# Patient Record
Sex: Male | Born: 1955
Health system: Southern US, Community
[De-identification: ages and names within clinical notes are randomized; demographics above are authoritative.]

## PROBLEM LIST (undated history)

## (undated) DIAGNOSIS — C801 Malignant (primary) neoplasm, unspecified: Secondary | ICD-10-CM

## (undated) DIAGNOSIS — E785 Hyperlipidemia, unspecified: Secondary | ICD-10-CM

## (undated) DIAGNOSIS — I10 Essential (primary) hypertension: Secondary | ICD-10-CM

## (undated) DIAGNOSIS — E119 Type 2 diabetes mellitus without complications: Secondary | ICD-10-CM

## (undated) DIAGNOSIS — Z9889 Other specified postprocedural states: Secondary | ICD-10-CM

## (undated) DIAGNOSIS — R112 Nausea with vomiting, unspecified: Secondary | ICD-10-CM

## (undated) DIAGNOSIS — Z8719 Personal history of other diseases of the digestive system: Secondary | ICD-10-CM

## (undated) HISTORY — DX: Personal history of other diseases of the digestive system: Z87.19

## (undated) HISTORY — DX: Essential (primary) hypertension: I10

## (undated) HISTORY — DX: Other specified postprocedural states: Z98.890

## (undated) HISTORY — DX: Hyperlipidemia, unspecified: E78.5

## (undated) HISTORY — PX: HEMORRHOID SURGERY: SHX153

## (undated) HISTORY — PX: HERNIA REPAIR: SHX51

## (undated) HISTORY — DX: Malignant (primary) neoplasm, unspecified: C80.1

---

## 1999-06-28 ENCOUNTER — Encounter (INDEPENDENT_AMBULATORY_CARE_PROVIDER_SITE_OTHER): Payer: Self-pay | Admitting: *Deleted

## 1999-06-28 ENCOUNTER — Inpatient Hospital Stay (HOSPITAL_COMMUNITY): Admission: AD | Admit: 1999-06-28 | Discharge: 1999-07-17 | Payer: Self-pay | Admitting: Nephrology

## 1999-06-28 ENCOUNTER — Encounter: Payer: Self-pay | Admitting: Nephrology

## 1999-06-29 ENCOUNTER — Encounter: Payer: Self-pay | Admitting: Nephrology

## 1999-07-03 ENCOUNTER — Encounter: Payer: Self-pay | Admitting: Nephrology

## 2003-07-17 ENCOUNTER — Ambulatory Visit (HOSPITAL_COMMUNITY): Admission: RE | Admit: 2003-07-17 | Discharge: 2003-07-17 | Payer: Self-pay | Admitting: Nephrology

## 2003-07-24 ENCOUNTER — Ambulatory Visit (HOSPITAL_COMMUNITY): Admission: RE | Admit: 2003-07-24 | Discharge: 2003-07-24 | Payer: Self-pay | Admitting: Nephrology

## 2004-08-19 ENCOUNTER — Ambulatory Visit (HOSPITAL_COMMUNITY): Admission: RE | Admit: 2004-08-19 | Discharge: 2004-08-19 | Payer: Self-pay | Admitting: Cardiology

## 2005-07-28 ENCOUNTER — Ambulatory Visit (HOSPITAL_COMMUNITY): Admission: RE | Admit: 2005-07-28 | Discharge: 2005-07-28 | Payer: Self-pay | Admitting: Nephrology

## 2005-07-28 ENCOUNTER — Encounter: Payer: Self-pay | Admitting: Vascular Surgery

## 2006-03-04 ENCOUNTER — Ambulatory Visit (HOSPITAL_COMMUNITY): Admission: RE | Admit: 2006-03-04 | Discharge: 2006-03-04 | Payer: Self-pay | Admitting: General Surgery

## 2006-04-28 DIAGNOSIS — C801 Malignant (primary) neoplasm, unspecified: Secondary | ICD-10-CM

## 2006-04-28 HISTORY — DX: Malignant (primary) neoplasm, unspecified: C80.1

## 2006-11-29 ENCOUNTER — Emergency Department (HOSPITAL_COMMUNITY): Admission: EM | Admit: 2006-11-29 | Discharge: 2006-11-29 | Payer: Self-pay | Admitting: Emergency Medicine

## 2007-02-17 ENCOUNTER — Inpatient Hospital Stay (HOSPITAL_COMMUNITY): Admission: RE | Admit: 2007-02-17 | Discharge: 2007-02-18 | Payer: Self-pay | Admitting: Urology

## 2007-02-17 ENCOUNTER — Encounter (INDEPENDENT_AMBULATORY_CARE_PROVIDER_SITE_OTHER): Payer: Self-pay | Admitting: Urology

## 2007-02-17 HISTORY — PX: PROSTATE SURGERY: SHX751

## 2008-06-06 ENCOUNTER — Encounter: Admission: RE | Admit: 2008-06-06 | Discharge: 2008-06-06 | Payer: Self-pay | Admitting: Nephrology

## 2010-05-19 ENCOUNTER — Encounter: Payer: Self-pay | Admitting: Nephrology

## 2010-09-10 NOTE — Op Note (Signed)
Stephen [name], MALAND NO.:  [id number]   MEDICAL RECORD NO.:  [id number]          PATIENT TYPE:  INP   LOCATION:  1425                         FACILITY:  [facility]   PHYSICIAN:  Valetta Fuller, M.D.  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   PROCEDURE PERFORMED:  Robotic-assisted laparoscopic radical retropubic  prostatectomy.   SURGEON:  Valetta Fuller, M.D.   ASSISTANT:  Sheppard Plumber. Earlene Plater, M.D.   ANESTHESIA:  General endotracheal.   INDICATIONS:  Mr. Cargile is a 55 year old male who was recently  diagnosed with favorable clinical stage T1c adenocarcinoma of the  prostate by Dr. Su Grand.  The patient had a very minimally elevated  PSA of approximately 4.  Biopsy revealed low-volume Gleason 3 + 3 = 6  tumor at the left base and left apex.  The patient's AUA symptom score  was minimal.  The patient did have poor preoperative sexual functioning.  The patient underwent extensive consultation with Dr. Su Grand as well  as myself with regard to treatment options.  He elected to have a  surgical approach.  He appeared to understand the advantages and  disadvantages of this approach.  We talked about issues related to  surgery, potential complications and specifically spent long period of  time discussing incontinence and sexual dysfunction issues.  The patient  has done the preparatory steps and has received preoperative education.  Preoperative Unasyn in was administered and compression boots were  placed prior to induction of anesthesia.   TECHNIQUE AND FINDINGS:  The patient was brought to the operating room,  where he had successful induction of general endotracheal anesthesia.  He  was placed in the mid lithotomy position.  He was secured to the  table of all extremities carefully padded and protected.  He was secured  to the table and then placed in steep Trendelenburg position.  He was  prepped and draped in the usual manner.  A Foley catheter was inserted  sterilely on the field.  The initial camera port incision site was  chosen to the left of the umbilicus, about 18 cm above the pubic  symphysis.  A standard open Hasson technique was utilized.  The  peritoneal cavity was entered and no obvious adhesions underneath the  incision were encountered.  A 12-mm trocar was placed and the abdominal  cavity was insufflated without incident.  All other trocars were then  placed with direct visual guidance; this included 12- and 5-mm assist  ports and three 8-mm robotic ports.  Once these ports were all in  position, the surgical cart was docked.  The bladder was filled and the  space of Retzius was entered by dropping the bladder posteriorly.  The  prostatic tissue and bladder neck area were defatted to allow for  careful identification of the endopelvic fascia  and bladder neck  regions.  The endopelvic fascia to the lateral aspect of the prostate  was then incised from apex to base.  The levator musculature was swept  off the apex of the prostate and dorsal venous complex to isolate that  structure.  Puboprostatic ligaments were taken down sharply.  The dorsal  vein was then ligated with an ETS stapling device.  Good hemostasis was  encountered.  The underlying urethra was uninjured.  The bladder neck  was then incised anteriorly with identification of this structure  utilizing the Foley catheter balloon.  Once the balloon was found in the  midline, it was retracted anteriorly.  There was no evidence of a middle  lobe component.  Indigo carmine was given and we were clearly well away  from the ureteral orifices.  The posterior bladder neck was then  transected, allowing  for identification posteriorly of the vas and  seminal vesicles.   The vas and seminal vesicles were individually isolated and dissected  free and then used for anterior retraction.  The posterior plane between  the prostate and rectum was then easily established.   Attention was then turned towards bilateral nerve spare.  The  superficial fascia overlying the prostate in the lateral aspect was then  incised and then the plane was established between the neurovascular  bundle and the capsule of the prostate.  The neurovascular bundle was  then bluntly dissected from apex to base.  Once the neurovascular bundle  was dropped off the lateral aspect of the prostate, we were able to lift  the prostate anteriorly and identify the vascular pedicles, which were  taken down with surgical clips; this was then done bilaterally and then  the prostate was only attached by some thin fascial attachments and the  urethra.  The urethra was then transected anteriorly and the catheter  was removed, allowing for completion of the urethral transection.  The  prostatic specimen was then brought out of the pelvis.  There was no  evidence of any rectal injury.   Attention was then turned towards reconstruction.  Some posterior  Denonvilliers' fascia was reapproximated utilizing 2-0 Vicryl suture to  allow for increased bladder support.  The urethra and bladder neck were  then reapproximated at the 6 o'clock position utilizing a single 2-0  Vicryl suture.  Indigo carmine was again given and we were well away  from the orifices.  The anastomosis was then completed in a 360-degree  running manner utilizing double-armed 3-0 Monocryl suture.  A new coude  catheter was placed without difficulty and the bladder irrigated,  demonstrating a watertight anastomosis.  Through one of the robotic  ports, we went ahead and placed a round drain into the pelvis, which was  then secured to the skin.  The prostatic specimen was  placed in the  Endopouch.  The 12-mm assist port was closed with a 0 Vicryl suture with  the aid of a suture passer under direct visual guidance.  All other  ports were then removed with direct visual guidance.  The camera port  incision had to be extended slightly and the specimen was then removed.  That fascia was then closed with a running 0 Vicryl suture.  All wounds  were infiltrated with Marcaine and then clips were used to close the  skin.  Blood loss was minimal and the patient remained completely stable  through the procedure.  The bladder irrigated clear.  The patient was  brought to the  recovery room in stable condition.           ______________________________  Valetta Fuller, M.D.  Electronically Signed     DSG/MEDQ  D:  02/18/2007  T:  02/19/2007  Job:  [id number]

## 2010-09-13 NOTE — Op Note (Signed)
NAME:  GAY, RAPE NO.:  [id number]   MEDICAL RECORD NO.:  [id number]          PATIENT TYPE:  AMB   LOCATION:  DAY                          FACILITY:  [facility]   PHYSICIAN:  Ollen Gross. Vernell Morgans, M.D. DATE OF BIRTH:  [birth year]   DATE OF PROCEDURE:  03/04/2006  DATE OF DISCHARGE:                               OPERATIVE REPORT   PREOPERATIVE DIAGNOSIS:  Internal hemorrhoids.   POSTOPERATIVE DIAGNOSIS:  Internal hemorrhoids.   PROCEDURE:  Internal hemorrhoid banding x3.   SURGEON:  Ollen Gross. Carolynne Edouard, M.D.   ANESTHESIA:  General via LMA.   DESCRIPTION OF PROCEDURE:  After informed consent was obtained, the  patient was brought to the operating room and placed in the supine  position on the operating table.  After the adequate induction of  general anesthesia, the patient was placed in the lithotomy position.  His perirectal area was prepped with Betadine and draped in the usual  sterile manner.  The perirectal area was then infiltrated 0.25% Marcaine  with epinephrine.  The tissue was massaged for several minutes.  A  bullet type retractor was then used to examine the rectum.  The patient  had some significant internal hemorrhoidal tissue, the most prominent  areas of internal hemorrhoidal tissue were anteriorly and then left and  right posteriorly.  A band was placed on each of these making sure that  the band was deep to the dentate line.  Once this was accomplished and  the bands appeared to be in good position, the area was completely  hemostatic. Sterile dressings were then applied and the patient  tolerated the procedure well.  The patient was then awakened and taken  to the recovery room in stable condition.      Ollen Gross. Vernell Morgans, M.D.  Electronically Signed     PST/MEDQ  D:  03/04/2006  T:  03/04/2006  Job:  [id number]

## 2010-09-13 NOTE — Discharge Summary (Signed)
[place]  Patient:    Stephen [name], Stephen [name]                         MRN: [id number] Adm. Date:  06/28/99 Disc. Date: 07/17/99 Attending:  Jarome Matin, M.D.                           Discharge Summary  ADMITTING DIAGNOSES: Acute elevation of BUN and creatinine, which appears to be acute failure of his kidneys after what appears to have been a viral type illness.  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  SGOT was 2360, SGPT was up to 910, and the patient came to the office on June 27, 1999.  Laboratories at that time showed a BUN up to 78 and creatinine 5.6.  SGOT was 2103 and SGPT was 795.  He was becoming more and more nauseated with more back pain, and we admitted the patient to the hospital.  The day of admission his BUN was up to 121 and creatinine was 8.8.  His liver functions still were abnormal.  Urine was brown.  His sodium and chloride were gradually falling.  His sodium was 127 and chloride was 88.  PHYSICAL EXAMINATION:  VITAL SIGNS: Blood pressure somewhat elevated at 156/90, temperature 98 degrees, pulse 66, respirations 20.  GENERAL: The patient was alert and oriented x 3.  He stated he had become nauseated and could not keep anything down.  HEENT: Negative.  SKIN: Skin and membranes were dry.  CHEST: Clear.  HEART: Regular sinus rhythm.  ABDOMEN:  Soft, no masses, no organomegaly.  EXTREMITIES: No leg edema and no edema anywhere.  LABORATORY DATA: Chest x-ray was normal.  HOSPITAL COURSE: At first we thought of the possibility of acute glomerulonephritis; however, he had a lot of protein in his urine, greater than 300.  Pertinent laboratories did show ANA was negative.  Anti-DNA was negative.  We did culture his urine and this had no significant growth.  Blood was cultured. Hemoglobin was large in amount in his urine, glucose was 100 and then negative.  Urobilinogen was 1.0.  A few epithelial cells were noted but he had just 0-5 wbc/hpf, 0-5 rbc/hpf, and rare bacteria noted in the urine sediment.  We tried to collect a 24 hour urine but only got 250 cc.  C4 and C3 compliments were low, 14.3 and 16, which actually was in the normal range. PSA was 2.44, which was in the normal range.  His liver chemistries continued to be elevated but gradually were coming down; however, CK was elevated and CK-MB was fairly well elevated.  Sodium was fairly normal.  Potassium was normal.  Glucose was running high at 294 and 156.  Creatinine continued to rise and actually rose to about 10.7, with creatinine of 143, but BUN went  up to 152.  Sedimentation rate also rose up to 77.  The patient was apparently getting worse.  His EKG showed a normal sinus rhythm, normal EKG.  We started him on dialysis and that seemed to help some; however, his BUN and creatinine continued to rise.  We did a renal biopsy on the patient and we received a call from Dr. Marland Kitchen the next day and he commented that it looked like acute tubular necrosis but it also looked like some evidence of - nephropathy which indicated possible myoglobinuria or hemoglobinuria.  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  We dialyzed him in the oliguric phase, which he went through first, and we then went to a diuretic phase, at which time we continued to give him adequate IV fluids.  The patient developed some gastritis and we used some Nexium to help cut down on the acid.  We at one point had started him on some steroids; however, we decided to go ahead and taper that off.  The patient denied any cocaine or alcoholic abuse.  He has a family and he seems to be a fairly stable individual.  We then followed him with dialysis and as an acute renal failure patient.  The patient gradually began to go through diuretic phase of ATN and after dialyzing him three to four times he began to stabilize.  His creatinine gradually reduced without dialysis and we kept the fluids going at an adequate rate to maintain his fluid balance and watched closely for electrolyte imbalance.  Gradually the patients renal function improved markedly, with his BUN going up to 11.3 and down to 24, creatinine down to 2.  The patient was eating and drinking well, and feeling well, and we decided we would talk to him about hydration, talk to him about when he started driving his truck again to drink plenty of fluids and take more frequent breaks and remain hydrated.  The patient seemed to respond favorably to these suggestions.  At the time of discharge many of his laboratory values had come back to normal.  We put him on Pepcid as an outpatient for his stomach upset.  We told him that we may well get an Upper GI Series if he continued to have problems as an outpatient.  His pressure did remain elevated and we decided to keep him on some blood pressure medication and put him on Catapres 0.1 mg t.i.d.  At the time of discharge  his BUN was 24, creatinine 2.0.  SPECIAL INSTRUCTIONS: He was told to drink plenty of fluids.  FOLLOW-UP: I am to see him in the office in a week before he goes back to  work, and we will recheck his renal and liver functions.  Both had gone back nearly to normal and the patient was feeling much better at the time of discharge. DD:  09/25/99 TD:  09/27/99 Job: [id number]

## 2010-12-09 ENCOUNTER — Ambulatory Visit
Admission: RE | Admit: 2010-12-09 | Discharge: 2010-12-09 | Disposition: A | Discharge status: Home / Self Care | Payer: BC Managed Care – PPO | Source: Ambulatory Visit | Attending: Nephrology | Admitting: Nephrology

## 2010-12-09 ENCOUNTER — Other Ambulatory Visit: Payer: Self-pay | Admitting: Nephrology

## 2010-12-09 DIAGNOSIS — Z87891 Personal history of nicotine dependence: Secondary | ICD-10-CM

## 2011-02-05 LAB — HEMOGLOBIN AND HEMATOCRIT, BLOOD
HCT: 46.7
Hemoglobin: 15.8

## 2011-02-05 LAB — DIFFERENTIAL
Eosinophils Relative: 0
Lymphs Abs: 0.8
Monocytes Absolute: 0.7
Monocytes Relative: 10
Neutro Abs: 6

## 2011-02-05 LAB — BASIC METABOLIC PANEL
BUN: 10
CO2: 26
Calcium: 9.4
Chloride: 104
Creatinine, Ser: 1.05
Creatinine, Ser: 1.12
GFR calc non Af Amer: >60
GFR calc non Af Amer: >60
Glucose, Bld: 141 — ABNORMAL HIGH
Sodium: 141

## 2011-02-05 LAB — CBC
HCT: 39.9
MCV: 85.8
RBC: 4.65
RDW: 13.6

## 2011-02-05 LAB — TYPE AND SCREEN

## 2011-02-10 LAB — URINE CULTURE: Culture: NO GROWTH

## 2011-02-10 LAB — URINALYSIS, ROUTINE W REFLEX MICROSCOPIC
Bilirubin Urine: NEGATIVE
Glucose, UA: NEGATIVE
pH: 6

## 2011-02-10 LAB — URINE MICROSCOPIC-ADD ON

## 2011-03-11 ENCOUNTER — Encounter (INDEPENDENT_AMBULATORY_CARE_PROVIDER_SITE_OTHER): Payer: Self-pay | Admitting: General Surgery

## 2011-03-12 ENCOUNTER — Ambulatory Visit (INDEPENDENT_AMBULATORY_CARE_PROVIDER_SITE_OTHER): Payer: BC Managed Care – PPO | Admitting: General Surgery

## 2011-03-12 ENCOUNTER — Encounter (INDEPENDENT_AMBULATORY_CARE_PROVIDER_SITE_OTHER): Payer: Self-pay | Admitting: General Surgery

## 2011-03-12 VITALS — BP 160/106 | HR 64 | Temp 97.4°F | Resp 18 | Ht 68.0 in | Wt 199.5 lb

## 2011-03-12 DIAGNOSIS — K4090 Unilateral inguinal hernia, without obstruction or gangrene, not specified as recurrent: Secondary | ICD-10-CM | POA: Insufficient documentation

## 2011-03-12 NOTE — Progress Notes (Signed)
Subjective:     Patient ID: Stephen [name], male   DOB: 12/10/1955, 55 y.o.   MRN: [id number]  HPI We're asked to see the patient in consultation by Dr. Grapey to evaluate him for right inguinal hernia. The patient is a 55-year-old black male who first started to notice a bulge in his right groin around springtime. He denies any pain in the area. He denies any nausea or vomiting. He denies any chest pain or shortness of breath. He has normal bowel movements. 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   HENT: Negative.   Eyes: Negative.   Respiratory: Negative.   Cardiovascular: Negative.   Gastrointestinal: Negative.   Genitourinary: Negative.   Musculoskeletal: Negative.   Skin: Negative.   Neurological: Negative.   Hematological: Negative.   Psychiatric/Behavioral: Negative.    Past Medical History  Diagnosis Date  . Hypertension   . Cancer   . Hyperlipidemia    Past Surgical History  Procedure Date  . Hemorrhoid surgery   . Prostate surgery 02/17/07   Current Outpatient Prescriptions  Medication Sig Dispense Refill  . Alprostadil (PROSTAGLANDIN E1) POWD by Does not apply route.        . aspirin 81 MG tablet Take 81 mg by mouth daily.        . fish oil-omega-3 fatty acids 1000 MG capsule Take 2 g by mouth daily.        . losartan (COZAAR) 50 MG tablet Take 50 mg by mouth daily.         No Known Allergies     Objective:   Physical Exam  Constitutional: He is oriented to person, place, and time. He appears well-developed and well-nourished.  HENT:  Head: Normocephalic and atraumatic.  Eyes: Conjunctivae and EOM are normal. Pupils are equal, round, and reactive to light.  Neck: Normal range of motion. Neck supple.  Cardiovascular: Normal rate, regular rhythm and normal heart sounds.   Pulmonary/Chest: Effort normal and breath sounds normal.  Abdominal: Soft. Bowel sounds are normal.    Genitourinary: Penis normal.       The patient has a small reducible bulge in the right groin. No palpable bulge or impulse with straining in the left groin.  Musculoskeletal: Normal range of motion.  Neurological: He is alert and oriented to person, place, and time.  Skin: Skin is warm and dry.  Psychiatric: He has a normal mood and affect. His behavior is normal.       Assessment:     Small right inguinal hernia    Plan:     Because of the risk of incarceration and strangulation I think he would benefit from having this fixed. He would also like to have this done. I have discussed with him in detail the risks and benefits of the operation to fix the hernia as well as some of the technical aspects including the use of mesh as well as the risk of injury to the testicular artery and vas and he understands and wishes to proceed.      

## 2011-03-24 ENCOUNTER — Encounter (HOSPITAL_BASED_OUTPATIENT_CLINIC_OR_DEPARTMENT_OTHER): Payer: Self-pay | Admitting: *Deleted

## 2011-03-24 NOTE — Progress Notes (Signed)
To come in for bmet and ekg 

## 2011-03-27 ENCOUNTER — Encounter (HOSPITAL_BASED_OUTPATIENT_CLINIC_OR_DEPARTMENT_OTHER)
Admission: RE | Admit: 2011-03-27 | Discharge: 2011-03-27 | Disposition: A | Discharge status: Home / Self Care | Payer: BC Managed Care – PPO | Source: Ambulatory Visit | Attending: General Surgery | Admitting: General Surgery

## 2011-03-27 LAB — BASIC METABOLIC PANEL
BUN: 14 mg/dL (ref 6–23)
Chloride: 100 mEq/L (ref 96–112)
Creatinine, Ser: 1.04 mg/dL (ref 0.50–1.35)
Glucose, Bld: 97 mg/dL (ref 70–99)
Potassium: 4.1 mEq/L (ref 3.5–5.1)

## 2011-03-28 ENCOUNTER — Encounter (HOSPITAL_BASED_OUTPATIENT_CLINIC_OR_DEPARTMENT_OTHER): Payer: Self-pay | Admitting: *Deleted

## 2011-03-28 ENCOUNTER — Encounter (HOSPITAL_BASED_OUTPATIENT_CLINIC_OR_DEPARTMENT_OTHER): Payer: Self-pay | Admitting: Anesthesiology

## 2011-03-28 ENCOUNTER — Ambulatory Visit (HOSPITAL_BASED_OUTPATIENT_CLINIC_OR_DEPARTMENT_OTHER): Payer: BC Managed Care – PPO | Admitting: Anesthesiology

## 2011-03-28 ENCOUNTER — Ambulatory Visit (HOSPITAL_BASED_OUTPATIENT_CLINIC_OR_DEPARTMENT_OTHER)
Admission: RE | Admit: 2011-03-28 | Discharge: 2011-03-28 | Disposition: A | Discharge status: Home / Self Care | Payer: BC Managed Care – PPO | Source: Ambulatory Visit | Attending: General Surgery | Admitting: General Surgery

## 2011-03-28 ENCOUNTER — Encounter (HOSPITAL_BASED_OUTPATIENT_CLINIC_OR_DEPARTMENT_OTHER)
Admission: RE | Disposition: A | Discharge status: Home / Self Care | Payer: Self-pay | Source: Ambulatory Visit | Attending: General Surgery

## 2011-03-28 ENCOUNTER — Ambulatory Visit: Admit: 2011-03-28 | Payer: Self-pay | Admitting: General Surgery

## 2011-03-28 DIAGNOSIS — I10 Essential (primary) hypertension: Secondary | ICD-10-CM | POA: Insufficient documentation

## 2011-03-28 DIAGNOSIS — K4090 Unilateral inguinal hernia, without obstruction or gangrene, not specified as recurrent: Secondary | ICD-10-CM

## 2011-03-28 DIAGNOSIS — Z01812 Encounter for preprocedural laboratory examination: Secondary | ICD-10-CM | POA: Insufficient documentation

## 2011-03-28 HISTORY — PX: INGUINAL HERNIA REPAIR: SHX194

## 2011-03-28 LAB — POCT HEMOGLOBIN-HEMACUE: Hemoglobin: 16.6 g/dL (ref 13.0–17.0)

## 2011-03-28 SURGERY — REPAIR, HERNIA, INGUINAL, ADULT
Anesthesia: General | Laterality: Right

## 2011-03-28 MED ORDER — CEFAZOLIN SODIUM-DEXTROSE 2-3 GM-% IV SOLR
2.0000 g | INTRAVENOUS | Status: AC
  Administered 2011-03-28: 2 g via INTRAVENOUS

## 2011-03-28 MED ORDER — PROPOFOL 10 MG/ML IV EMUL
INTRAVENOUS | Status: DC | PRN
  Administered 2011-03-28: 200 mg via INTRAVENOUS

## 2011-03-28 MED ORDER — PROMETHAZINE HCL 25 MG RE SUPP
25.0000 mg | Freq: Four times a day (QID) | RECTAL | Status: DC | PRN
  Administered 2011-03-28: 25 mg via RECTAL

## 2011-03-28 MED ORDER — BUPIVACAINE-EPINEPHRINE 0.25% -1:200000 IJ SOLN
INTRAMUSCULAR | Status: DC | PRN
  Administered 2011-03-28: 30 mL

## 2011-03-28 MED ORDER — MIDAZOLAM HCL 2 MG/2ML IJ SOLN
2.0000 mg | Freq: Once | INTRAMUSCULAR | Status: AC
  Administered 2011-03-28: 2 mg via INTRAVENOUS

## 2011-03-28 MED ORDER — BUPIVACAINE HCL (PF) 0.5 % IJ SOLN
INTRAMUSCULAR | Status: DC | PRN
  Administered 2011-03-28: 30 mL

## 2011-03-28 MED ORDER — FENTANYL CITRATE 0.05 MG/ML IJ SOLN
INTRAMUSCULAR | Status: DC | PRN
  Administered 2011-03-28 (×2): 25 ug via INTRAVENOUS

## 2011-03-28 MED ORDER — HYDROCODONE-ACETAMINOPHEN 5-325 MG PO TABS
1.0000 | ORAL_TABLET | ORAL | Status: AC | PRN
Start: 2011-03-28 — End: 2011-04-07

## 2011-03-28 MED ORDER — DROPERIDOL 2.5 MG/ML IJ SOLN: 0.6250 mg | INTRAMUSCULAR | Status: DC | PRN

## 2011-03-28 MED ORDER — LACTATED RINGERS IV SOLN
INTRAVENOUS | Status: DC
  Administered 2011-03-28 (×3): via INTRAVENOUS

## 2011-03-28 MED ORDER — DEXAMETHASONE SODIUM PHOSPHATE 4 MG/ML IJ SOLN
INTRAMUSCULAR | Status: DC | PRN
  Administered 2011-03-28: 10 mg via INTRAVENOUS

## 2011-03-28 MED ORDER — ONDANSETRON HCL 4 MG/2ML IJ SOLN
INTRAMUSCULAR | Status: DC | PRN
  Administered 2011-03-28: 4 mg via INTRAVENOUS

## 2011-03-28 MED ORDER — FENTANYL CITRATE 0.05 MG/ML IJ SOLN
100.0000 ug | Freq: Once | INTRAMUSCULAR | Status: AC
  Administered 2011-03-28: 100 ug via INTRAVENOUS

## 2011-03-28 MED ORDER — FENTANYL CITRATE 0.05 MG/ML IJ SOLN
25.0000 ug | INTRAMUSCULAR | Status: DC | PRN
  Administered 2011-03-28: 25 ug via INTRAVENOUS

## 2011-03-28 MED ORDER — HYDROMORPHONE HCL PF 1 MG/ML IJ SOLN: 0.2500 mg | INTRAMUSCULAR | Status: DC | PRN

## 2011-03-28 SURGICAL SUPPLY — 47 items
APL SKNCLS STERI-STRIP NONHPOA (GAUZE/BANDAGES/DRESSINGS)
BENZOIN TINCTURE PRP APPL 2/3 (GAUZE/BANDAGES/DRESSINGS) IMPLANT
BLADE HEX COATED 2.75 (ELECTRODE) ×2 IMPLANT
BLADE SURG 15 STRL LF DISP TIS (BLADE) ×1 IMPLANT
BLADE SURG 15 STRL SS (BLADE) ×1
BLADE SURG ROTATE 9660 (MISCELLANEOUS) IMPLANT
CHLORAPREP W/TINT 26ML (MISCELLANEOUS) ×2 IMPLANT
CLOTH BEACON ORANGE TIMEOUT ST (SAFETY) ×2 IMPLANT
COVER MAYO STAND STRL (DRAPES) ×2 IMPLANT
COVER TABLE BACK 60X90 (DRAPES) ×2 IMPLANT
DECANTER SPIKE VIAL GLASS SM (MISCELLANEOUS) IMPLANT
DERMABOND ADVANCED (GAUZE/BANDAGES/DRESSINGS) ×1
DERMABOND ADVANCED .7 DNX12 (GAUZE/BANDAGES/DRESSINGS) ×1 IMPLANT
DRAIN PENROSE 1/2X12 LTX STRL (WOUND CARE) ×2 IMPLANT
DRAPE LAPAROTOMY TRNSV 102X78 (DRAPE) ×2 IMPLANT
DRAPE UTILITY XL STRL (DRAPES) ×2 IMPLANT
ELECT REM PT RETURN 9FT ADLT (ELECTROSURGICAL) ×2
ELECTRODE REM PT RTRN 9FT ADLT (ELECTROSURGICAL) ×1 IMPLANT
GLOVE BIO SURGEON STRL SZ7 (GLOVE) ×2 IMPLANT
GLOVE BIO SURGEON STRL SZ7.5 (GLOVE) ×4 IMPLANT
GOWN PREVENTION PLUS XLARGE (GOWN DISPOSABLE) IMPLANT
MESH HERNIA SYS ULTRAPRO LRG (Mesh General) ×2 IMPLANT
NEEDLE HYPO 22GX1.5 SAFETY (NEEDLE) IMPLANT
NEEDLE HYPO 25X1 1.5 SAFETY (NEEDLE) ×2 IMPLANT
NS IRRIG 1000ML POUR BTL (IV SOLUTION) ×2 IMPLANT
PACK BASIN DAY SURGERY FS (CUSTOM PROCEDURE TRAY) ×2 IMPLANT
PAIN PUMP ON-Q 100MLX2ML 2.5IN (PAIN MANAGEMENT) IMPLANT
PENCIL BUTTON HOLSTER BLD 10FT (ELECTRODE) ×2 IMPLANT
SLEEVE SCD COMPRESS KNEE MED (MISCELLANEOUS) ×2 IMPLANT
SPONGE GAUZE 4X4 12PLY (GAUZE/BANDAGES/DRESSINGS) ×2 IMPLANT
SPONGE LAP 18X18 X RAY DECT (DISPOSABLE) ×2 IMPLANT
STRIP CLOSURE SKIN 1/2X4 (GAUZE/BANDAGES/DRESSINGS) IMPLANT
SUT MON AB 4-0 PC3 18 (SUTURE) ×2 IMPLANT
SUT PROLENE 2 0 SH DA (SUTURE) ×4 IMPLANT
SUT SILK 2 0 SH (SUTURE) ×2 IMPLANT
SUT SILK 3 0 SH 30 (SUTURE) IMPLANT
SUT SILK 3 0 TIES 17X18 (SUTURE) ×1
SUT SILK 3-0 18XBRD TIE BLK (SUTURE) ×1 IMPLANT
SUT VIC AB 0 SH 27 (SUTURE) ×2 IMPLANT
SUT VIC AB 2-0 SH 27 (SUTURE) ×2
SUT VIC AB 2-0 SH 27XBRD (SUTURE) ×1 IMPLANT
SUT VIC AB 3-0 SH 27 (SUTURE) ×1
SUT VIC AB 3-0 SH 27X BRD (SUTURE) ×1 IMPLANT
SYR CONTROL 10ML LL (SYRINGE) ×2 IMPLANT
TOWEL OR 17X24 6PK STRL BLUE (TOWEL DISPOSABLE) ×4 IMPLANT
TOWEL OR NON WOVEN STRL DISP B (DISPOSABLE) ×2 IMPLANT
WATER STERILE IRR 1000ML POUR (IV SOLUTION) ×2 IMPLANT

## 2011-03-28 NOTE — Transfer of Care (Signed)
Immediate Anesthesia Transfer of Care Note  Patient: Stephen [name]  Procedure(s) Performed:  HERNIA REPAIR INGUINAL ADULT  Patient Location: PACU  Anesthesia Type: GA combined with regional for post-op pain  Level of Consciousness: sedated and patient cooperative  Airway & Oxygen Therapy: Patient Spontanous Breathing and Patient connected to face mask oxygen  Post-op Assessment: Report given to PACU RN and Post -op Vital signs reviewed and stable  Post vital signs: Reviewed and stable  Complications: No apparent anesthesia complications

## 2011-03-28 NOTE — Anesthesia Preprocedure Evaluation (Addendum)
Anesthesia Evaluation  Patient identified by MRN, date of birth, ID band Patient awake    Reviewed: Allergy & Precautions, H&P , NPO status , Patient's Chart, lab work & pertinent test results  Airway       Dental   Pulmonary neg pulmonary ROS,  clear to auscultation  Pulmonary exam normal       Cardiovascular hypertension, Pt. on medications Regular Normal- Systolic murmurs    Neuro/Psych Negative Neurological ROS     GI/Hepatic Neg liver ROS,   Endo/Other  Negative Endocrine ROS  Renal/GU negative Renal ROS     Musculoskeletal   Abdominal   Peds  Hematology   Anesthesia Other Findings   Reproductive/Obstetrics                           Anesthesia Physical Anesthesia Plan  ASA: II  Anesthesia Plan: General   Post-op Pain Management:    Induction: Intravenous  Airway Management Planned: LMA  Additional Equipment:   Intra-op Plan:   Post-operative Plan: Extubation in OR  Informed Consent: I have reviewed the patients History and Physical, chart, labs and discussed the procedure including the risks, benefits and alternatives for the proposed anesthesia with the patient or authorized representative who has indicated his/her understanding and acceptance.     Plan Discussed with: CRNA and Surgeon  Anesthesia Plan Comments: (TAP block for POP.)       Anesthesia Quick Evaluation

## 2011-03-28 NOTE — Procedures (Signed)
Assisted Dr. Singer with right, transabdominal plane block. Side rails up, monitors on throughout procedure. See vital signs in flow sheet. Tolerated Procedure well. 

## 2011-03-28 NOTE — Interval H&P Note (Signed)
History and Physical Interval Note:  03/28/2011 11:07 AM  Stephen [name]  has presented today for surgery, with the diagnosis of right inguinal hernia  The various methods of treatment have been discussed with the patient and family. After consideration of risks, benefits and other options for treatment, the patient has consented to  Procedure(s): HERNIA REPAIR INGUINAL ADULT as a surgical intervention .  The patients' history has been reviewed, patient examined, no change in status, stable for surgery.  I have reviewed the patients' chart and labs.  Questions were answered to the patient's satisfaction.     TOTH III,PAUL S

## 2011-03-28 NOTE — Op Note (Signed)
03/28/2011  12:51 PM  PATIENT:  Stephen Stephen [name]  55 y.o. male  PRE-OPERATIVE DIAGNOSIS:  right inguinal hernia  POST-OPERATIVE DIAGNOSIS:  right inguinal hernia  PROCEDURE:  Procedure(s): Right HERNIA REPAIR INGUINAL ADULT  SURGEON:  Surgeon(s): Caleen Essex III, MD  PHYSICIAN ASSISTANT:   ASSISTANTS: none   ANESTHESIA:   general  EBL:  Total I/O In: 1000 [I.V.:1000] Out: -   BLOOD ADMINISTERED:none  DRAINS: none   LOCAL MEDICATIONS USED:  MARCAINE 30CC  SPECIMEN:  No Specimen  DISPOSITION OF SPECIMEN:  N/A  COUNTS:  YES  TOURNIQUET:  * No tourniquets in log *  DICTATION: .Dragon Dictation  Stephen [name] OF CARE: Discharge to home after PACU  PATIENT DISPOSITION:  PACU - hemodynamically stable.   Procedure: After informed consent was obtained the patient was brought to the operating room placed in the supine position on the operating room table. After adequate and induction of general anesthesia the patient's right abdomen and groinarea were prepped with ChloraPrep, allowed to dry, and draped in usual sterile manner. The right groin area was then infiltrated with quarter percent Marcaine. A small incision was made from the edge of the pubic tubercle on the right towards the anteriorsuperior iliac spine. This incision was carried down through the subcutaneous tissue sharply with electrocautery until the fascia of the external oblique was encountered. A small bridging vein was clamped with hemostats divided and ligated with 3-0 silk ties. The external oblique was opened along its fibers towards the apex of the external ring. A [place] retractor was deployed. Blunt dissection was carried out of the cord until the cord could be surrounded between 2 fingers. A half-inch Penrose drain was placed around the cord structures for retraction purposes. The cord was gently skeletonized and there was no hernia sac associated with the cord. There was a broad defect of the floor of the canal.  The hernia was easily reduced and the floor of the canal was repaired with a running 0 Vicryl stitch. The tails were left long at the edge of the cord. A 3 x 6 piece of ultrapro mesh was chosen and cut to fit. The mesh was sewn inferiorly to the shelving edge of the inguinal ligament with a running 2-0 Prolene stitch.superiorly the mesh was sewed to the musculoaponeurotic strength layer of the transversalis with interrupted 2-0 Prolene vertical mattress stitches. Tails were cut in the mesh laterally. The tails were wrapped around the cord structures and anchored to the shelving edge of inguinal ligament with an interrupted 2-0 Prolene stitch. Once this was accomplished the mesh was in good position in the hernia appeared to be well repaired.the ilioinguinal nerve was identified along the superior edge of the operative bed and was spared. The wound was then irrigated with copious amounts of saline. The external oblique was reapproximated with a running 2-0 Vicryl stitch. The subcutaneous fascia was reapproximated with a running 3-0 Vicryl stitch. The skin was then closed with a running 4-0 Monocryl subcuticular stitch. A Dermabond dressing was applied. At the end of the case all needle sponge and instrument counts were correct. The patient was awakened and taken to recovery in stable condition. The patient's testicles in the scrotum at the end of the case.

## 2011-03-28 NOTE — Anesthesia Procedure Notes (Addendum)
Anesthesia Regional Block:  TAP block  Pre-Anesthetic Checklist: ,, timeout performed, Correct Patient, Correct Site, Correct Laterality, Correct Procedure, Correct Position, site marked, Risks and benefits discussed,  Surgical consent,  Pre-op evaluation,  At surgeon's request and post-op pain management  Laterality: Right  Prep: chloraprep       Needles:  Injection technique: Single-shot  Needle Type: Echogenic Stimulator Needle          Additional Needles:  Procedures: ultrasound guided TAP block Narrative:  Start time: 03/28/2011 11:04 AM End time: 03/28/2011 11:14 AM Anesthesiologist: Halford Decamp, MD  Additional Notes: A functioning IV was confirmed and monitors were applied.  Sterile prep and drape, hand hygiene and sterile gloves were used.  Negative aspiration and test dose prior to incremental administration of local anesthetic. The patient tolerated the procedure well. Ultrasound guidance: relevent anatomy identified, needle position confirmed, local anesthetic spread visualized in appropriate plane, vascular puncture avoided.  Image printed for medical record.    Procedure Name: LMA Insertion Date/Time: 03/28/2011 11:35 AM Performed by: Jearld Shines Pre-anesthesia Checklist: Patient identified, Timeout performed, Emergency Drugs available, Suction available and Patient being monitored Patient Re-evaluated:Patient Re-evaluated prior to inductionOxygen Delivery Method: Circle System Utilized Preoxygenation: Pre-oxygenation with 100% oxygen Intubation Type: IV induction Ventilation: Mask ventilation without difficulty LMA: LMA inserted LMA Size: 5.0 Number of attempts: 1 Placement Confirmation: positive ETCO2 and breath sounds checked- equal and bilateral Tube secured with: Tape Dental Injury: Teeth and Oropharynx as per pre-operative assessment

## 2011-03-28 NOTE — H&P (View-Only) (Signed)
Subjective:     Patient ID: Stephen [name], male   DOB: 08/01/1955, 55 y.o.   MRN: [id number]  HPI We're asked to see the patient in consultation by Dr. Isabel Caprice to evaluate him for right inguinal hernia. The patient is a 55 year old black male who first started to notice a bulge in his right groin around springtime. He denies any pain in the area. He denies any nausea or vomiting. He denies any chest pain or shortness of breath. He has normal bowel movements. 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   HENT: Negative.   Eyes: Negative.   Respiratory: Negative.   Cardiovascular: Negative.   Gastrointestinal: Negative.   Genitourinary: Negative.   Musculoskeletal: Negative.   Skin: Negative.   Neurological: Negative.   Hematological: Negative.   Psychiatric/Behavioral: Negative.    Past Medical History  Diagnosis Date  . Hypertension   . Cancer   . Hyperlipidemia    Past Surgical History  Procedure Date  . Hemorrhoid surgery   . Prostate surgery 02/17/07   Current Outpatient Prescriptions  Medication Sig Dispense Refill  . Alprostadil (PROSTAGLANDIN E1) POWD by Does not apply route.        Marland Kitchen aspirin 81 MG tablet Take 81 mg by mouth daily.        . fish oil-omega-3 fatty acids 1000 MG capsule Take 2 g by mouth daily.        Marland Kitchen losartan (COZAAR) 50 MG tablet Take 50 mg by mouth daily.         No Known Allergies     Objective:   Physical Exam  Constitutional: He is oriented to person, place, and time. He appears well-developed and well-nourished.  HENT:  Head: Normocephalic and atraumatic.  Eyes: Conjunctivae and EOM are normal. Pupils are equal, round, and reactive to light.  Neck: Normal range of motion. Neck supple.  Cardiovascular: Normal rate, regular rhythm and normal heart sounds.   Pulmonary/Chest: Effort normal and breath sounds normal.  Abdominal: Soft. Bowel sounds are normal.    Genitourinary: Penis normal.       The patient has a small reducible bulge in the right groin. No palpable bulge or impulse with straining in the left groin.  Musculoskeletal: Normal range of motion.  Neurological: He is alert and oriented to person, place, and time.  Skin: Skin is warm and dry.  Psychiatric: He has a normal mood and affect. His behavior is normal.       Assessment:     Small right inguinal hernia    Plan:     Because of the risk of incarceration and strangulation I think he would benefit from having this fixed. He would also like to have this done. I have discussed with him in detail the risks and benefits of the operation to fix the hernia as well as some of the technical aspects including the use of mesh as well as the risk of injury to the testicular artery and vas and he understands and wishes to proceed.

## 2011-03-28 NOTE — Anesthesia Postprocedure Evaluation (Signed)
Anesthesia Post Note  Patient: Stephen [name]  Procedure(s) Performed:  HERNIA REPAIR INGUINAL ADULT  Anesthesia type: General  Patient location: PACU  Post pain: Pain level controlled  Post assessment: Patient's Cardiovascular Status Stable  Last Vitals:  Filed Vitals:   03/28/11 1500  BP: 167/96  Pulse: 56  Temp:   Resp: 13    Post vital signs: Reviewed and stable  Level of consciousness: sedated  Complications: No apparent anesthesia complications

## 2011-04-01 ENCOUNTER — Encounter (HOSPITAL_BASED_OUTPATIENT_CLINIC_OR_DEPARTMENT_OTHER): Payer: Self-pay | Admitting: General Surgery

## 2011-04-30 ENCOUNTER — Ambulatory Visit (INDEPENDENT_AMBULATORY_CARE_PROVIDER_SITE_OTHER): Payer: BC Managed Care – PPO | Admitting: General Surgery

## 2011-04-30 ENCOUNTER — Encounter (INDEPENDENT_AMBULATORY_CARE_PROVIDER_SITE_OTHER): Payer: Self-pay | Admitting: General Surgery

## 2011-04-30 VITALS — BP 142/100 | HR 88 | Temp 98.0°F | Resp 16 | Ht 68.0 in | Wt 203.2 lb

## 2011-04-30 DIAGNOSIS — K4090 Unilateral inguinal hernia, without obstruction or gangrene, not specified as recurrent: Secondary | ICD-10-CM

## 2011-04-30 NOTE — Progress Notes (Signed)
Subjective:     Patient ID: Stephen [name], male   DOB: July 22, 1955, 56 y.o.   MRN: [id number]  HPI The patient is a 56 year old black male who is now one month out from a right inguinal hernia repair with mesh. He initially had a lot of soreness but this is now resolving. He seems to move around fairly comfortably. His appetite is good and his bowels are working normally.  Review of Systems     Objective:   Physical Exam On exam his abdomen is soft and nontender. His right inguinal incision is healing nicely with no sign of infection. He has no palpable evidence for recurrence of the hernia.   Assessment:     One month postop from a right inguinal hernia repair with mesh    Plan:     At this point I would like him to refrain from lifting anything heavy for another week or 2. After that he can return to work. We will plan to see him back in about another month to check his progress.

## 2011-04-30 NOTE — Patient Instructions (Signed)
No heavy lifting for about 2 more weeks

## 2011-05-29 ENCOUNTER — Encounter (INDEPENDENT_AMBULATORY_CARE_PROVIDER_SITE_OTHER): Payer: Self-pay | Admitting: General Surgery

## 2011-05-29 ENCOUNTER — Ambulatory Visit (INDEPENDENT_AMBULATORY_CARE_PROVIDER_SITE_OTHER): Payer: BC Managed Care – PPO | Admitting: General Surgery

## 2011-05-29 VITALS — BP 144/92 | HR 65 | Temp 98.8°F | Ht 68.0 in | Wt 210.0 lb

## 2011-05-29 DIAGNOSIS — K4090 Unilateral inguinal hernia, without obstruction or gangrene, not specified as recurrent: Secondary | ICD-10-CM

## 2011-05-29 NOTE — Progress Notes (Signed)
Subjective:     Patient ID: Stephen [name], male   DOB: 1955-05-15, 56 y.o.   MRN: [id number]  HPI The patient is a 56 year old black male who is now 2 months out from a right inguinal hernia repair with mesh. Overall he seems to be doing very well. He will still have occasional episodes of soreness in the right groin when he does too much. Most days though he has no pain at all.  Review of Systems     Objective:   Physical Exam On exam his abdomen is soft and nontender. His right groin incision is healing nicely. He has no palpable evidence for recurrence of the hernia.    Assessment:     2 months status post right inguinal hernia repair with mesh    Plan:     At this point I believe he can return to all his normal activities without any restrictions. We will plan to see him back on a p.r.n. basis

## 2011-05-29 NOTE — Patient Instructions (Signed)
May return to all normal activities 

## 2012-03-01 ENCOUNTER — Other Ambulatory Visit: Payer: Self-pay | Admitting: Nephrology

## 2012-03-01 ENCOUNTER — Ambulatory Visit
Admission: RE | Admit: 2012-03-01 | Discharge: 2012-03-01 | Disposition: A | Discharge status: Home / Self Care | Payer: BC Managed Care – PPO | Source: Ambulatory Visit | Attending: Nephrology | Admitting: Nephrology

## 2012-03-01 DIAGNOSIS — R52 Pain, unspecified: Secondary | ICD-10-CM

## 2015-01-15 ENCOUNTER — Other Ambulatory Visit: Payer: Self-pay | Admitting: Internal Medicine

## 2015-01-15 ENCOUNTER — Ambulatory Visit
Admission: RE | Admit: 2015-01-15 | Discharge: 2015-01-15 | Disposition: A | Discharge status: Home / Self Care | Payer: Commercial Managed Care - PPO | Source: Ambulatory Visit | Attending: Internal Medicine | Admitting: Internal Medicine

## 2015-01-15 DIAGNOSIS — C61 Malignant neoplasm of prostate: Secondary | ICD-10-CM

## 2015-07-24 ENCOUNTER — Other Ambulatory Visit: Payer: Self-pay | Admitting: Internal Medicine

## 2015-07-24 DIAGNOSIS — R109 Unspecified abdominal pain: Secondary | ICD-10-CM

## 2015-07-31 ENCOUNTER — Ambulatory Visit
Admission: RE | Admit: 2015-07-31 | Discharge: 2015-07-31 | Disposition: A | Discharge status: Home / Self Care | Payer: Commercial Managed Care - PPO | Source: Ambulatory Visit | Attending: Internal Medicine | Admitting: Internal Medicine

## 2015-07-31 DIAGNOSIS — R109 Unspecified abdominal pain: Secondary | ICD-10-CM

## 2017-02-05 DIAGNOSIS — Z0000 Encounter for general adult medical examination without abnormal findings: Secondary | ICD-10-CM | POA: Diagnosis not present

## 2017-02-05 DIAGNOSIS — I10 Essential (primary) hypertension: Secondary | ICD-10-CM | POA: Diagnosis not present

## 2017-02-05 DIAGNOSIS — R7303 Prediabetes: Secondary | ICD-10-CM | POA: Diagnosis not present

## 2017-02-05 DIAGNOSIS — Z23 Encounter for immunization: Secondary | ICD-10-CM | POA: Diagnosis not present

## 2017-02-05 DIAGNOSIS — Z1159 Encounter for screening for other viral diseases: Secondary | ICD-10-CM | POA: Diagnosis not present

## 2017-02-05 DIAGNOSIS — Z1389 Encounter for screening for other disorder: Secondary | ICD-10-CM | POA: Diagnosis not present

## 2017-02-05 DIAGNOSIS — E782 Mixed hyperlipidemia: Secondary | ICD-10-CM | POA: Diagnosis not present

## 2017-02-05 DIAGNOSIS — C61 Malignant neoplasm of prostate: Secondary | ICD-10-CM | POA: Diagnosis not present

## 2017-03-11 DIAGNOSIS — C61 Malignant neoplasm of prostate: Secondary | ICD-10-CM | POA: Diagnosis not present

## 2017-03-20 DIAGNOSIS — H53021 Refractive amblyopia, right eye: Secondary | ICD-10-CM | POA: Diagnosis not present

## 2018-01-25 DIAGNOSIS — Z1211 Encounter for screening for malignant neoplasm of colon: Secondary | ICD-10-CM | POA: Diagnosis not present

## 2018-01-25 DIAGNOSIS — K594 Anal spasm: Secondary | ICD-10-CM | POA: Diagnosis not present

## 2018-02-02 DIAGNOSIS — K5730 Diverticulosis of large intestine without perforation or abscess without bleeding: Secondary | ICD-10-CM | POA: Diagnosis not present

## 2018-02-02 DIAGNOSIS — Z1211 Encounter for screening for malignant neoplasm of colon: Secondary | ICD-10-CM | POA: Diagnosis not present

## 2018-03-10 DIAGNOSIS — Z125 Encounter for screening for malignant neoplasm of prostate: Secondary | ICD-10-CM | POA: Diagnosis not present

## 2018-03-10 DIAGNOSIS — C61 Malignant neoplasm of prostate: Secondary | ICD-10-CM | POA: Diagnosis not present

## 2018-03-10 DIAGNOSIS — Z23 Encounter for immunization: Secondary | ICD-10-CM | POA: Diagnosis not present

## 2018-03-10 DIAGNOSIS — E782 Mixed hyperlipidemia: Secondary | ICD-10-CM | POA: Diagnosis not present

## 2018-03-10 DIAGNOSIS — I10 Essential (primary) hypertension: Secondary | ICD-10-CM | POA: Diagnosis not present

## 2018-03-10 DIAGNOSIS — Z0000 Encounter for general adult medical examination without abnormal findings: Secondary | ICD-10-CM | POA: Diagnosis not present

## 2018-03-10 DIAGNOSIS — R7303 Prediabetes: Secondary | ICD-10-CM | POA: Diagnosis not present

## 2018-03-12 DIAGNOSIS — C61 Malignant neoplasm of prostate: Secondary | ICD-10-CM | POA: Diagnosis not present

## 2018-09-13 DIAGNOSIS — Z8546 Personal history of malignant neoplasm of prostate: Secondary | ICD-10-CM | POA: Diagnosis not present

## 2018-09-13 DIAGNOSIS — R7303 Prediabetes: Secondary | ICD-10-CM | POA: Diagnosis not present

## 2021-10-02 ENCOUNTER — Other Ambulatory Visit (HOSPITAL_COMMUNITY): Payer: Self-pay | Admitting: Urology

## 2021-10-02 DIAGNOSIS — C61 Malignant neoplasm of prostate: Secondary | ICD-10-CM

## 2021-10-07 ENCOUNTER — Encounter (HOSPITAL_COMMUNITY)
Admission: RE | Admit: 2021-10-07 | Discharge: 2021-10-07 | Disposition: A | Discharge status: Home / Self Care | Payer: Commercial Managed Care - PPO | Source: Ambulatory Visit | Attending: Urology | Admitting: Urology

## 2021-10-07 DIAGNOSIS — C61 Malignant neoplasm of prostate: Secondary | ICD-10-CM | POA: Diagnosis present

## 2021-10-07 MED ORDER — PIFLIFOLASTAT F 18 (PYLARIFY) INJECTION
9.0000 | Freq: Once | INTRAVENOUS | Status: AC
  Administered 2021-10-07: 8.2 via INTRAVENOUS

## 2022-01-15 ENCOUNTER — Ambulatory Visit
Admission: EM | Admit: 2022-01-15 | Discharge: 2022-01-15 | Disposition: A | Discharge status: Home / Self Care | Payer: Commercial Managed Care - PPO | Attending: Nurse Practitioner | Admitting: Nurse Practitioner

## 2022-01-15 ENCOUNTER — Encounter: Payer: Self-pay | Admitting: Emergency Medicine

## 2022-01-15 ENCOUNTER — Other Ambulatory Visit: Payer: Self-pay

## 2022-01-15 DIAGNOSIS — J029 Acute pharyngitis, unspecified: Secondary | ICD-10-CM | POA: Diagnosis not present

## 2022-01-15 DIAGNOSIS — Z1152 Encounter for screening for COVID-19: Secondary | ICD-10-CM | POA: Diagnosis not present

## 2022-01-15 DIAGNOSIS — J069 Acute upper respiratory infection, unspecified: Secondary | ICD-10-CM | POA: Insufficient documentation

## 2022-01-15 LAB — RESP PANEL BY RT-PCR (FLU A&B, COVID) ARPGX2
Influenza A by PCR: NEGATIVE
Influenza B by PCR: NEGATIVE
SARS Coronavirus 2 by RT PCR: NEGATIVE

## 2022-01-15 LAB — POCT RAPID STREP A (OFFICE): Rapid Strep A Screen: NEGATIVE

## 2022-01-15 MED ORDER — CETIRIZINE HCL 10 MG PO TABS: 10.0000 mg | ORAL_TABLET | Freq: Every day | ORAL | 0 refills | Status: DC

## 2022-01-15 MED ORDER — FLUTICASONE PROPIONATE 50 MCG/ACT NA SUSP: 2.0000 | Freq: Every day | NASAL | 0 refills | Status: DC

## 2022-01-15 MED ORDER — PROMETHAZINE-DM 6.25-15 MG/5ML PO SYRP: 5.0000 mL | ORAL_SOLUTION | Freq: Four times a day (QID) | ORAL | 0 refills | Status: DC | PRN

## 2022-01-15 NOTE — ED Provider Notes (Signed)
[facility]    CSN: [id number] Arrival date & time: 01/15/22  0813      History   Chief Complaint Chief Complaint  Patient presents with   Sore Throat    HPI Stephen [name] is a 66 y.o. male.   The history is provided by the patient.   Patient presents with a 2-day history of sore throat, nasal congestion, and cough.  Patient states that his mother was recently diagnosed with the same or similar symptoms.  He denies fever, chills, ear pain, wheezing, shortness of breath, difficulty breathing, or GI symptoms.  Patient states that his throat pain worsens at night and in the morning and improves throughout the day.  Patient states that he does have a history of reflux disease.  He states that he has been taking Advil and using normal saline nasal spray for his symptoms.  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  Past Medical History:  Diagnosis Date   Cancer [facility]) 2008   prostate   History of inguinal hernia repair    right   Hyperlipidemia    Hypertension     Patient Active Problem List   Diagnosis Date Noted   Right inguinal hernia 03/12/2011    Past Surgical History:  Procedure Laterality Date   HEMORRHOID SURGERY     HERNIA REPAIR     INGUINAL HERNIA REPAIR  03/28/2011   Procedure: HERNIA REPAIR INGUINAL ADULT;  Surgeon: Merrie Roof, MD;  Location: [place];  Service: General;  Laterality: Right;   PROSTATE SURGERY  02/17/07       Home Medications    Prior to Admission medications   Medication Sig Start Date End Date Taking? Authorizing Provider  atorvastatin (LIPITOR) 10 MG tablet Take 10 mg by mouth daily.   Yes [provider]  cetirizine (ZYRTEC) 10 MG tablet Take 1 tablet (10 mg total) by mouth daily. 01/15/22  Yes Felecia Stanfill-Warren, Alda Lea, NP  fluticasone (FLONASE) 50 MCG/ACT nasal spray Place 2 sprays into both nostrils daily. 01/15/22  Yes Zylen Wenig-Warren, Alda Lea, NP  promethazine-dextromethorphan  (PROMETHAZINE-DM) 6.25-15 MG/5ML syrup Take 5 mLs by mouth 4 (four) times daily as needed for cough. 01/15/22  Yes Glenys Snader-Warren, Alda Lea, NP  Alprostadil (PROSTAGLANDIN E1) POWD by Does not apply route.      [provider]  aspirin 81 MG tablet Take 81 mg by mouth daily.      [provider]  fish oil-omega-3 fatty acids 1000 MG capsule Take 2 g by mouth daily.      [provider]  losartan (COZAAR) 50 MG tablet Take 50 mg by mouth daily.      [provider]    Family History Family History  Problem Relation Age of Onset   Diabetes Father    Heart disease Father     Social History Social History   Tobacco Use   Smoking status: Former   Smokeless tobacco: Never  Substance Use Topics   Alcohol use: No   Drug use: No     Allergies   Patient has no known allergies.   Review of Systems Review of Systems Per HPI  Physical Exam Triage Vital Signs ED Triage Vitals  Enc Vitals Group     BP 01/15/22 0838 (!) 160/68     Pulse Rate 01/15/22 0838 77     Resp 01/15/22 0838 20     Temp 01/15/22 0838 98.3 F (36.8 C)  Temp Source 01/15/22 0838 Oral     SpO2 01/15/22 0838 98 %     Weight --      Height --      Head Circumference --      Peak Flow --      Pain Score 01/15/22 0839 1     Pain Loc --      Pain Edu? --      Excl. in [place]? --    No data found.  Updated Vital Signs BP (!) 160/68 (BP Location: Right Arm)   Pulse 77   Temp 98.3 F (36.8 C) (Oral)   Resp 20   SpO2 98%   Visual Acuity Right Eye Distance:   Left Eye Distance:   Bilateral Distance:    Right Eye Near:   Left Eye Near:    Bilateral Near:     Physical Exam Vitals and nursing note reviewed.  Constitutional:      General: He is not in acute distress.    Appearance: Normal appearance. He is well-developed.  HENT:     Head: Normocephalic.     Right Ear: Tympanic membrane, ear canal and external ear normal.     Left Ear: Tympanic membrane, ear canal  and external ear normal.     Nose: Congestion present.     Right Turbinates: Enlarged and swollen.     Left Turbinates: Enlarged and swollen.     Right Sinus: No maxillary sinus tenderness or frontal sinus tenderness.     Left Sinus: No maxillary sinus tenderness or frontal sinus tenderness.     Mouth/Throat:     Lips: Pink.     Mouth: Mucous membranes are moist.     Pharynx: Uvula midline. Posterior oropharyngeal erythema and uvula swelling present. No oropharyngeal exudate.     Tonsils: 1+ on the right. 1+ on the left.  Eyes:     Extraocular Movements: Extraocular movements intact.     Conjunctiva/sclera: Conjunctivae normal.     Pupils: Pupils are equal, round, and reactive to light.  Cardiovascular:     Rate and Rhythm: Normal rate and regular rhythm.     Pulses: Normal pulses.     Heart sounds: Normal heart sounds.  Pulmonary:     Effort: Pulmonary effort is normal. No respiratory distress.     Breath sounds: Normal breath sounds. No stridor. No wheezing, rhonchi or rales.  Abdominal:     General: Bowel sounds are normal. There is no distension.     Palpations: Abdomen is soft.     Tenderness: There is no abdominal tenderness.  Musculoskeletal:     Cervical back: Normal range of motion.  Lymphadenopathy:     Cervical: No cervical adenopathy.  Skin:    General: Skin is warm and dry.  Neurological:     General: No focal deficit present.     Mental Status: He is alert and oriented to person, place, and time.  Psychiatric:        Mood and Affect: Mood normal.        Behavior: Behavior normal.      UC Treatments / Results  Labs (all labs ordered are listed, but only abnormal results are displayed) Labs Reviewed  RESP PANEL BY RT-PCR (FLU A&B, COVID) ARPGX2  CULTURE, GROUP A STREP [facility])  POCT RAPID STREP A (OFFICE)    EKG   Radiology No results found.  Procedures Procedures (including critical care time)  Medications Ordered in UC Medications - No data to  display  Initial Impression / Assessment and Plan / UC Course  I have reviewed the triage vital signs and the nursing notes.  Pertinent labs & imaging results that were available during my care of the patient were reviewed by me and considered in my medical decision making (see chart for details).  Patient presents with a 2-day history of sore throat, nasal congestion, and cough.  On exam, patient's vital signs are stable, he is in no acute distress.  Rapid strep test was negative, COVID test pending at this time.  Differential diagnoses include viral upper respiratory infection with cough, COVID, and seasonal allergies.  Symptomatic treatment was provided for the patient to include Promethazine DM, cetirizine, and Flonase.  Supportive care recommendations were provided to the patient.  Patient was also giving a work note until his COVID results are received.  Patient verbalizes understanding.  All questions were answered. Final Clinical Impressions(s) / UC Diagnoses   Final diagnoses:  Viral upper respiratory tract infection with cough  Encounter for screening for COVID-19  Sore throat     Discharge Instructions      Rapid strep test is negative, COVID/flu test and throat culture are pending.  If the results of your test are positive, you will be contacted.  As discussed, if your COVID test is positive, you are a candidate to receive molnupiravir as an antiviral. Take medication as prescribed. Increase fluids and allow for plenty of rest. Recommend Tylenol or ibuprofen as needed for pain, fever, or general discomfort. Recommend throat lozenges, Chloraseptic or honey to help with throat pain. Warm salt water gargles 3-4 times daily to help with throat pain or discomfort. Recommend a diet with soft foods to include soups, broths, puddings, yogurt, Jell-O's, or popsicles until symptoms improve. Follow-up in this clinic or with your primary care physician if your symptoms fail to improve..        ED Prescriptions     Medication Sig Dispense Auth. Provider   promethazine-dextromethorphan (PROMETHAZINE-DM) 6.25-15 MG/5ML syrup Take 5 mLs by mouth 4 (four) times daily as needed for cough. 140 mL Paden Kuras-Warren, Alda Lea, NP   fluticasone (FLONASE) 50 MCG/ACT nasal spray Place 2 sprays into both nostrils daily. 16 g Laiza Veenstra-Warren, Alda Lea, NP   cetirizine (ZYRTEC) 10 MG tablet Take 1 tablet (10 mg total) by mouth daily. 30 tablet Aijalon Kirtz-Warren, Alda Lea, NP      PDMP not reviewed this encounter.   Tish Men, NP 01/15/22 0945

## 2022-01-15 NOTE — Discharge Instructions (Addendum)
Rapid strep test is negative, COVID/flu test and throat culture are pending.  If the results of your test are positive, you will be contacted.  As discussed, if your COVID test is positive, you are a candidate to receive molnupiravir as an antiviral. Take medication as prescribed. Increase fluids and allow for plenty of rest. Recommend Tylenol or ibuprofen as needed for pain, fever, or general discomfort. Recommend throat lozenges, Chloraseptic or honey to help with throat pain. Warm salt water gargles 3-4 times daily to help with throat pain or discomfort. Recommend a diet with soft foods to include soups, broths, puddings, yogurt, Jell-O's, or popsicles until symptoms improve. Follow-up in this clinic or with your primary care physician if your symptoms fail to improve.Marland Kitchen

## 2022-01-15 NOTE — ED Triage Notes (Signed)
Pt reports sore throat and cough since Monday. Pt denies any other symptoms or known fever. Pt reports mother had something similar and tested negative for covid.

## 2022-01-18 LAB — CULTURE, GROUP A STREP (THRC)

## 2022-09-13 ENCOUNTER — Other Ambulatory Visit: Payer: Self-pay

## 2022-09-13 ENCOUNTER — Inpatient Hospital Stay (HOSPITAL_COMMUNITY)
Admission: EM | Admit: 2022-09-13 | Discharge: 2022-09-15 | DRG: 322 | Disposition: A | Discharge status: Home / Self Care | Payer: 59 | Attending: Internal Medicine | Admitting: Internal Medicine

## 2022-09-13 ENCOUNTER — Emergency Department (HOSPITAL_COMMUNITY): Payer: 59

## 2022-09-13 ENCOUNTER — Encounter (HOSPITAL_COMMUNITY): Payer: Self-pay

## 2022-09-13 DIAGNOSIS — I214 Non-ST elevation (NSTEMI) myocardial infarction: Secondary | ICD-10-CM | POA: Diagnosis present

## 2022-09-13 DIAGNOSIS — Z79899 Other long term (current) drug therapy: Secondary | ICD-10-CM

## 2022-09-13 DIAGNOSIS — E785 Hyperlipidemia, unspecified: Secondary | ICD-10-CM | POA: Diagnosis present

## 2022-09-13 DIAGNOSIS — R9431 Abnormal electrocardiogram [ECG] [EKG]: Secondary | ICD-10-CM | POA: Diagnosis not present

## 2022-09-13 DIAGNOSIS — Z87891 Personal history of nicotine dependence: Secondary | ICD-10-CM | POA: Diagnosis not present

## 2022-09-13 DIAGNOSIS — Z8546 Personal history of malignant neoplasm of prostate: Secondary | ICD-10-CM | POA: Diagnosis not present

## 2022-09-13 DIAGNOSIS — E118 Type 2 diabetes mellitus with unspecified complications: Secondary | ICD-10-CM | POA: Diagnosis present

## 2022-09-13 DIAGNOSIS — I252 Old myocardial infarction: Secondary | ICD-10-CM | POA: Diagnosis not present

## 2022-09-13 DIAGNOSIS — I10 Essential (primary) hypertension: Secondary | ICD-10-CM | POA: Diagnosis present

## 2022-09-13 DIAGNOSIS — I213 ST elevation (STEMI) myocardial infarction of unspecified site: Secondary | ICD-10-CM

## 2022-09-13 DIAGNOSIS — Z7984 Long term (current) use of oral hypoglycemic drugs: Secondary | ICD-10-CM | POA: Diagnosis not present

## 2022-09-13 DIAGNOSIS — Z8249 Family history of ischemic heart disease and other diseases of the circulatory system: Secondary | ICD-10-CM

## 2022-09-13 DIAGNOSIS — Z833 Family history of diabetes mellitus: Secondary | ICD-10-CM | POA: Diagnosis not present

## 2022-09-13 DIAGNOSIS — I2510 Atherosclerotic heart disease of native coronary artery without angina pectoris: Secondary | ICD-10-CM | POA: Diagnosis present

## 2022-09-13 DIAGNOSIS — Z7982 Long term (current) use of aspirin: Secondary | ICD-10-CM | POA: Diagnosis not present

## 2022-09-13 LAB — HEPARIN LEVEL (UNFRACTIONATED)
Heparin Unfractionated: 0.84 IU/mL — ABNORMAL HIGH (ref 0.30–0.70)
Heparin Unfractionated: 1 IU/mL — ABNORMAL HIGH (ref 0.30–0.70)

## 2022-09-13 LAB — CBC
HCT: 48.7 % (ref 39.0–52.0)
Hemoglobin: 16.5 g/dL (ref 13.0–17.0)
MCH: 29.1 pg (ref 26.0–34.0)
MCHC: 33.9 g/dL (ref 30.0–36.0)
MCV: 85.9 fL (ref 80.0–100.0)
Platelets: 201 10*3/uL (ref 150–400)
RBC: 5.67 MIL/uL (ref 4.22–5.81)
RDW: 13.1 % (ref 11.5–15.5)
WBC: 8.1 10*3/uL (ref 4.0–10.5)
nRBC: 0 % (ref 0.0–0.2)

## 2022-09-13 LAB — TROPONIN I (HIGH SENSITIVITY)
Troponin I (High Sensitivity): >24000 ng/L (ref <18)
Troponin I (High Sensitivity): >24000 ng/L (ref <18)

## 2022-09-13 LAB — GLUCOSE, CAPILLARY: Glucose-Capillary: 140 mg/dL — ABNORMAL HIGH (ref 70–99)

## 2022-09-13 LAB — MRSA NEXT GEN BY PCR, NASAL: MRSA by PCR Next Gen: NOT DETECTED

## 2022-09-13 LAB — BASIC METABOLIC PANEL
Anion gap: 10 (ref 5–15)
BUN: 14 mg/dL (ref 8–23)
CO2: 24 mmol/L (ref 22–32)
Calcium: 8.9 mg/dL (ref 8.9–10.3)
Chloride: 101 mmol/L (ref 98–111)
Creatinine, Ser: 0.96 mg/dL (ref 0.61–1.24)
GFR, Estimated: >60 mL/min (ref >60)
Glucose, Bld: 134 mg/dL — ABNORMAL HIGH (ref 70–99)
Potassium: 3.4 mmol/L — ABNORMAL LOW (ref 3.5–5.1)
Sodium: 135 mmol/L (ref 135–145)

## 2022-09-13 LAB — D-DIMER, QUANTITATIVE: D-Dimer, Quant: 0.39 ug/mL-FEU (ref 0.00–0.50)

## 2022-09-13 MED ORDER — ONDANSETRON HCL 4 MG/2ML IJ SOLN
4.0000 mg | Freq: Four times a day (QID) | INTRAMUSCULAR | Status: DC | PRN
  Filled 2022-09-13: qty 2

## 2022-09-13 MED ORDER — ATORVASTATIN CALCIUM 80 MG PO TABS
80.0000 mg | ORAL_TABLET | Freq: Every day | ORAL | Status: DC
  Administered 2022-09-13 – 2022-09-14 (×2): 80 mg via ORAL
  Filled 2022-09-13 (×2): qty 1

## 2022-09-13 MED ORDER — MORPHINE SULFATE (PF) 2 MG/ML IV SOLN: 2.0000 mg | INTRAVENOUS | Status: DC | PRN

## 2022-09-13 MED ORDER — NITROGLYCERIN 0.4 MG SL SUBL: 0.4000 mg | SUBLINGUAL_TABLET | SUBLINGUAL | Status: DC | PRN

## 2022-09-13 MED ORDER — HEPARIN (PORCINE) 25000 UT/250ML-% IV SOLN
1200.0000 [IU]/h | INTRAVENOUS | Status: DC
  Administered 2022-09-13: 1200 [IU]/h via INTRAVENOUS
  Filled 2022-09-13: qty 250

## 2022-09-13 MED ORDER — METOPROLOL SUCCINATE ER 50 MG PO TB24
50.0000 mg | ORAL_TABLET | Freq: Every day | ORAL | Status: DC
  Administered 2022-09-13: 50 mg via ORAL
  Filled 2022-09-13: qty 1

## 2022-09-13 MED ORDER — POTASSIUM CHLORIDE 10 MEQ/100ML IV SOLN
10.0000 meq | INTRAVENOUS | Status: AC
  Administered 2022-09-13 (×2): 10 meq via INTRAVENOUS
  Filled 2022-09-13 (×2): qty 100

## 2022-09-13 MED ORDER — ACETAMINOPHEN 325 MG PO TABS: 650.0000 mg | ORAL_TABLET | ORAL | Status: DC | PRN

## 2022-09-13 MED ORDER — HEPARIN BOLUS VIA INFUSION
4000.0000 [IU] | Freq: Once | INTRAVENOUS | Status: AC
  Administered 2022-09-13: 4000 [IU] via INTRAVENOUS

## 2022-09-13 MED ORDER — METOPROLOL TARTRATE 12.5 MG HALF TABLET
12.5000 mg | ORAL_TABLET | Freq: Two times a day (BID) | ORAL | Status: DC
  Administered 2022-09-14 – 2022-09-15 (×3): 12.5 mg via ORAL
  Filled 2022-09-13 (×3): qty 1

## 2022-09-13 MED ORDER — ASPIRIN 81 MG PO TBEC
81.0000 mg | DELAYED_RELEASE_TABLET | Freq: Every day | ORAL | Status: DC
  Administered 2022-09-14 – 2022-09-15 (×2): 81 mg via ORAL
  Filled 2022-09-13 (×2): qty 1

## 2022-09-13 MED ORDER — ASPIRIN 325 MG PO TABS
325.0000 mg | ORAL_TABLET | Freq: Once | ORAL | Status: AC
  Administered 2022-09-13: 325 mg via ORAL
  Filled 2022-09-13: qty 1

## 2022-09-13 MED ORDER — MORPHINE SULFATE (PF) 4 MG/ML IV SOLN
4.0000 mg | Freq: Once | INTRAVENOUS | Status: AC
  Administered 2022-09-13: 4 mg via INTRAVENOUS
  Filled 2022-09-13: qty 1

## 2022-09-13 NOTE — ED Provider Notes (Signed)
This patient is an ill-appearing 67 year old male, he has a history of borderline hypertension, borderline diabetes, takes his medications as prescribed.  He does not take an aspirin daily.  He states that when he got off of work several days ago he went to get on his spin bike in his bedroom and felt like he was having significant discomfort in his chest he was sweaty, he got off of the bike and had some difficulty falling asleep feeling like he had to set up, in the morning it was gone and he was able to function throughout the day.  When he got off work on Wednesday and then again on Thursday the exact same thing happened.  Last night when this occurred it has not stopped and he continues to be having chest pain and a heavy feeling on his chest.  He was diaphoretic at some points during this time and is still having some pain.  On exam he has clear heart and lung sounds, normal pulses, no edema, no JVD.  He has a totally normal neurologic exam of his upper extremities with normal sensation, equal pulses, normal strength.  I have personally viewed and interpreted his EKG which shows that he is got inferior T wave inversions as well as lateral precordial inversions, in conjunction with a troponin of over 20,000 he has had a non-ST elevation myocardial infarction.  In fact he already has Q waves in the inferior leads concerning for a infarct which is evolving.  I have repeated the EKG, it is unchanged, we will start heparin, the patient will be admitted, I will consult with cardiology, he is critically ill  .Critical Care  Performed by: Eber Hong, MD Authorized by: Eber Hong, MD   Critical care provider statement:    Critical care time (minutes):  45   Critical care time was exclusive of:  Separately billable procedures and treating other patients and teaching time   Critical care was necessary to treat or prevent imminent or life-threatening deterioration of the following conditions:  Cardiac  failure   Critical care was time spent personally by me on the following activities:  Development of treatment plan with patient or surrogate, discussions with consultants, evaluation of patient's response to treatment, examination of patient, obtaining history from patient or surrogate, ordering and performing treatments and interventions, ordering and review of laboratory studies, ordering and review of radiographic studies, pulse oximetry, re-evaluation of patient's condition and review of old charts   I assumed direction of critical care for this patient from another provider in my specialty: no     Care discussed with: admitting provider   Comments:        I discussed this patient's care with Dr. Carolan Clines who request the patient be transferred and admitted to [facility] to a cardiac telemetry bed, she agrees with nitroglycerin, the patient is already on a statin, we will add 50 mg of metoprolol at her request.  Final diagnoses:  NSTEMI (non-ST elevated myocardial infarction) [facility])      Eber Hong, MD 09/13/22 1733

## 2022-09-13 NOTE — ED Notes (Signed)
Attempted report to [facility] and will I call back

## 2022-09-13 NOTE — H&P (Signed)
Cardiology Admission History and Physical   Patient ID: SHER GILPIN MRN: [id number]; DOB: December 08, 1955   Admission date: 09/13/2022  PCP:  Georgann Housekeeper, MD   [facility] Providers Cardiologist:  None        Chief Complaint:  Chest pain  Patient Profile:   MYSHON ZWAHLEN is a 67 y.o. male with pmh sx for HTN, DM and remote hx of prostate ca who is being seen 09/13/2022 for the evaluation of NSTEMI.  History of Present Illness:   Mr. Tischner is a 67 y.o. male with pmh sx for HTN, DM and remote hx of prostate ca who is being seen 09/13/2022 for the evaluation of NSTEMI. He had been doing well since 2-3 days when he started having sudden chest pain. He had diaphoresis as well. This pain again recurred yesterday hence came to the ED. He felt heavy on his chest as well. Came to the ED where troponin was >24K. EKG showed ST depressions and TWI in inferior and lateral leads. Case was discussed with Dr. Wyline Mood and [facility], and he was accepted to come here for possible LHC on Monday if he remains HDS and CP free- as it was thought by Dr. Wyline Mood that he had already infarcted. Currently he reports no chest pain or SOB. VS are stable. Has some nausea. He was started on heparin in the ED.    Past Medical History:  Diagnosis Date   Cancer [facility]) 2008   prostate   History of inguinal hernia repair    right   Hyperlipidemia    Hypertension     Past Surgical History:  Procedure Laterality Date   HEMORRHOID SURGERY     HERNIA REPAIR     INGUINAL HERNIA REPAIR  03/28/2011   Procedure: HERNIA REPAIR INGUINAL ADULT;  Surgeon: Robyne Askew, MD;  Location: [facility];  Service: General;  Laterality: Right;   PROSTATE SURGERY  02/17/07     Medications Prior to Admission: Prior to Admission medications   Medication Sig Start Date End Date Taking? Authorizing Provider  atorvastatin (LIPITOR) 10 MG tablet Take 10 mg by mouth daily.   Yes [provider]   esomeprazole (NEXIUM) 20 MG capsule Take 20 mg by mouth daily at 12 noon.   Yes [provider]  Glucosamine Sulfate 1000 MG CAPS Take 2 capsules by mouth daily.   Yes [provider]  ibuprofen (ADVIL) 200 MG tablet Take 400 mg by mouth every 6 (six) hours as needed for moderate pain.   Yes [provider]  losartan-hydrochlorothiazide (HYZAAR) 100-12.5 MG tablet Take 1 tablet by mouth daily.   Yes [provider]  metFORMIN (GLUCOPHAGE) 500 MG tablet Take 500 mg by mouth daily.   Yes [provider]     Allergies:   No Known Allergies  Social History:   Social History   Socioeconomic History   Marital status: Divorced    Spouse name: Not on file   Number of children: Not on file   Years of education: Not on file   Highest education level: Not on file  Occupational History   Not on file  Tobacco Use   Smoking status: Former   Smokeless tobacco: Never  Substance and Sexual Activity   Alcohol use: No   Drug use: No   Sexual activity: Not on file  Other Topics Concern   Not on file  Social History Narrative   Not on file  Social Determinants of Health   Financial Resource Strain: Not on file  Food Insecurity: Not on file  Transportation Needs: Not on file  Physical Activity: Not on file  Stress: Not on file  Social Connections: Not on file  Intimate Partner Violence: Not on file    Family History:  The patient's family history includes Diabetes in his father; Heart disease in his father.    ROS:  Please see the history of present illness.  All other ROS reviewed and negative.     Physical Exam/Data:   Vitals:   09/13/22 1730 09/13/22 1826 09/13/22 1835 09/13/22 1932  BP: (!) 140/87  134/79 (!) 148/77  Pulse: 83  73 68  Resp: 15  18 20   Temp:   98.3 F (36.8 C) 98.4 F (36.9 C)  TempSrc:   Oral Oral  SpO2: 98%   95%  Weight:  87.8 kg    Height:        Intake/Output Summary (Last 24 hours) at 09/13/2022  2046 Last data filed at 09/13/2022 1900 Gross per 24 hour  Intake 72.91 ml  Output --  Net 72.91 ml      09/13/2022    6:26 PM 09/13/2022    3:30 PM 10/07/2021    1:00 PM  Last 3 Weights  Weight (lbs) 193 lb 8 oz 190 lb 210 lb 12.2 oz  Weight (kg) 87.771 kg 86.183 kg 95.6 kg     Body mass index is 29.42 kg/m.  General:  Well nourished, well developed, in no acute distress HEENT: normal Neck: no JVD Vascular: No carotid bruits; Distal pulses 2+ bilaterally   Cardiac:  normal S1, S2; RRR; no murmur  Lungs:  clear to auscultation bilaterally, no wheezing, rhonchi or rales  Abd: soft, nontender, no hepatomegaly  Ext: no edema Musculoskeletal:  No deformities, BUE and BLE strength normal and equal Skin: warm and dry  Neuro:  CNs 2-12 intact, no focal abnormalities noted Psych:  Normal affect   EKG:  The ECG that was done  was personally reviewed and demonstrates ST depressions and TWI in inferior and lateral leads  Laboratory Data:  High Sensitivity Troponin:   Recent Labs  Lab 09/13/22 1440 09/13/22 1603  TROPONINIHS >24,000* >24,000*      Chemistry Recent Labs  Lab 09/13/22 1440  NA 135  K 3.4*  CL 101  CO2 24  GLUCOSE 134*  BUN 14  CREATININE 0.96  CALCIUM 8.9  GFRNONAA >60  ANIONGAP 10    No results for input(s): "PROT", "ALBUMIN", "AST", "ALT", "ALKPHOS", "BILITOT" in the last 168 hours. Lipids No results for input(s): "CHOL", "TRIG", "HDL", "LABVLDL", "LDLCALC", "CHOLHDL" in the last 168 hours. Hematology Recent Labs  Lab 09/13/22 1440  WBC 8.1  RBC 5.67  HGB 16.5  HCT 48.7  MCV 85.9  MCH 29.1  MCHC 33.9  RDW 13.1  PLT 201   Thyroid No results for input(s): "TSH", "FREET4" in the last 168 hours. BNPNo results for input(s): "BNP", "PROBNP" in the last 168 hours.  DDimer  Recent Labs  Lab 09/13/22 1512  DDIMER 0.39     Radiology/Studies:  DG Chest 2 View  Result Date: 09/13/2022 CLINICAL DATA:  Chest pain EXAM: CHEST - 2 VIEW  COMPARISON:  Chest radiograph dated 01/15/2015 FINDINGS: Normal lung volumes. No focal consolidations. No pleural effusion or pneumothorax. The heart size and mediastinal contours are within normal limits. No acute osseous abnormality. IMPRESSION: No active cardiopulmonary disease. Electronically Signed   By: Benjaman Kindler  Xu M.D.   On: 09/13/2022 14:37     Assessment and Plan:   # NSTEMI # HLD # HTN  Patient coming with CP and troponin >24K and EKG changes showing ST depression and Twi in inferior and lateral leads.  Case was discussed with Dr. Wyline Mood and [facility], and he was accepted to come here for possible LHC on Monday if he remains HDS and CP free- as it was thought by Dr. Wyline Mood that he had already infarcted.  Aspirin 325 mg already administered Aspirin 81 mg daily IV Heparin Atorvastatin 80 mg Zofran for nausea Morphine for pain- currently no pain Metoprolol 12.5 mg BID Recheck EKG to ensure no STEMI Echo in AM If remains CP free and HD stable, LHC in AM per Dr. Wyline Mood   For questions or updates, please contact [facility] Please consult [email or web address] for contact info under     Signed, Hermelinda Dellen, MD  09/13/2022 8:46 PM

## 2022-09-13 NOTE — ED Notes (Signed)
2nd attempt to give report. [facility] at bedside.

## 2022-09-13 NOTE — ED Triage Notes (Signed)
Pt c/o chest pain that began last night around 1900. Describes as a stabbing pain and tightness. Denies any sob.

## 2022-09-13 NOTE — Progress Notes (Signed)
ANTICOAGULATION CONSULT NOTE - Follow Up Consult  Pharmacy Consult for Heparin Indication: chest pain/ACS  No Known Allergies  Patient Measurements: Height: 5\' 8"  (172.7 cm) Weight: 86.2 kg (190 lb) IBW/kg (Calculated) : 68.4 Heparin Dosing Weight: 85.7 kg  Vital Signs: Temp: 98.5 F (36.9 C) (05/18 1423) Temp Source: Oral (05/18 1423) BP: 155/88 (05/18 1530) Pulse Rate: 66 (05/18 1530)  Labs: Recent Labs    09/13/22 1440  HGB 16.5  HCT 48.7  PLT 201  CREATININE 0.96  TROPONINIHS >24,000*    Estimated Creatinine Clearance: 83 mL/min (by C-G formula based on SCr of 0.96 mg/dL).  Assessment: 66 yoM hx htn, hld admitted with CP and elevated troponins, pharmacy consulted to start systemic heparin. No contraindications noted  Goal of Therapy:  Heparin level 0.3-0.7 units/ml Monitor platelets by anticoagulation protocol: Yes   Plan:  Heparin bolus 4000 units Heparin gtt at 1200 units/hr HL in 6 hrs Daily HL and CBC  Caryl Asp, PharmD Clinical Pharmacist 09/13/2022 3:57 PM

## 2022-09-13 NOTE — ED Provider Notes (Signed)
[place] EMERGENCY DEPARTMENT AT [facility] Provider Note   CSN: [id number] Arrival date & time: 09/13/22  1417     History  Chief Complaint  Patient presents with   Chest Pain   HPI Stephen [name] is a 67 y.o. male with hypertension, hyperlipidemia, remote history of prostate cancer status prostatectomy presenting for chest pain.  Started 7 PM last night. Located in the center of his chest and at times radiates to the left chest.  Feels stabbing at times but now more dull and achy.  Nonreproducible.  Unsure if exertional.  Denies associated shortness of breath.  States he has been nauseous.  Denies calf tenderness.  Did mention that he had prostate cancer but his prostate was removed 2008.  No known coagulopathies.  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   Chest Pain      Home Medications Prior to Admission medications   Medication Sig Start Date End Date Taking? Authorizing Provider  Alprostadil (PROSTAGLANDIN E1) POWD by Does not apply route.      [provider]  aspirin 81 MG tablet Take 81 mg by mouth daily.      [provider]  atorvastatin (LIPITOR) 10 MG tablet Take 10 mg by mouth daily.    [provider]  cetirizine (ZYRTEC) 10 MG tablet Take 1 tablet (10 mg total) by mouth daily. 01/15/22   Leath-Warren, Sadie Haber, NP  fish oil-omega-3 fatty acids 1000 MG capsule Take 2 g by mouth daily.      [provider]  fluticasone (FLONASE) 50 MCG/ACT nasal spray Place 2 sprays into both nostrils daily. 01/15/22   Leath-Warren, Sadie Haber, NP  losartan (COZAAR) 50 MG tablet Take 50 mg by mouth daily.      [provider]  promethazine-dextromethorphan (PROMETHAZINE-DM) 6.25-15 MG/5ML syrup Take 5 mLs by mouth 4 (four) times daily as needed for cough. 01/15/22   Leath-Warren, Sadie Haber, NP      Allergies    Patient has no known allergies.    Review of  Systems   Review of Systems  Cardiovascular:  Positive for chest pain.    Physical Exam   Vitals:   09/13/22 1530 09/13/22 1558  BP: (!) 155/88   Pulse: 66 99  Resp: 15 19  Temp:    SpO2: 96% 98%    CONSTITUTIONAL:  well-appearing, NAD NEURO:  Alert and oriented x 3, CN 3-12 grossly intact EYES:  eyes equal and reactive ENT/NECK:  Supple, no stridor  CARDIO: Regular rate and rhythm, appears well-perfused, radial pulses 2+ PULM:  No respiratory distress, CTAB GI/GU:  non-distended, soft MSK/SPINE:  No gross deformities, no edema, moves all extremities  SKIN:  no rash, atraumatic  *Additional and/or pertinent findings included in MDM below  ED Results / Procedures / Treatments   Labs (all labs ordered are listed, but only abnormal results are displayed) Labs Reviewed  BASIC METABOLIC PANEL - Abnormal; Notable for the following components:      Result Value   Potassium 3.4 (*)    Glucose, Bld 134 (*)    All other components within normal limits  TROPONIN I (HIGH SENSITIVITY) - Abnormal; Notable for the following components:   Troponin I (High Sensitivity) >24,000 (*)    All other components within normal limits  CBC  D-DIMER, QUANTITATIVE  HEPARIN LEVEL (UNFRACTIONATED)  TROPONIN I (HIGH SENSITIVITY)  EKG EKG Interpretation  Date/Time:  Saturday Sep 13 2022 14:26:19 EDT Ventricular Rate:  70 PR Interval:  166 QRS Duration: 84 QT Interval:  400 QTC Calculation: 432 R Axis:   -54 Text Interpretation: Normal sinus rhythm with sinus arrhythmia Left anterior fascicular block Inferior infarct , age undetermined ST & T wave abnormality, consider lateral ischemia Abnormal ECG When compared with ECG of 04-Mar-2006 11:11, Significant changes have occurred Confirmed by Meridee Score [phone]) on 09/13/2022 2:34:10 PM  Radiology DG Chest 2 View  Result Date: 09/13/2022 CLINICAL DATA:  Chest pain EXAM: CHEST - 2 VIEW COMPARISON:  Chest radiograph dated 01/15/2015  FINDINGS: Normal lung volumes. No focal consolidations. No pleural effusion or pneumothorax. The heart size and mediastinal contours are within normal limits. No acute osseous abnormality. IMPRESSION: No active cardiopulmonary disease. Electronically Signed   By: Agustin Cree M.D.   On: 09/13/2022 14:37    Procedures .Critical Care  Performed by: Gareth Eagle, PA-C Authorized by: Gareth Eagle, PA-C   Critical care provider statement:    Critical care time (minutes):  30   Critical care was necessary to treat or prevent imminent or life-threatening deterioration of the following conditions:  Circulatory failure (NSTEMI)   Critical care was time spent personally by me on the following activities:  Development of treatment plan with patient or surrogate, discussions with consultants, evaluation of patient's response to treatment, examination of patient, ordering and review of laboratory studies, ordering and review of radiographic studies, ordering and performing treatments and interventions, pulse oximetry, re-evaluation of patient's condition, review of old charts and interpretation of cardiac output measurements     Medications Ordered in ED Medications  heparin ADULT infusion 100 units/mL (25000 units/257mL) (1,200 Units/hr Intravenous New Bag/Given 09/13/22 1612)  metoprolol succinate (TOPROL-XL) 24 hr tablet 50 mg (50 mg Oral Given 09/13/22 1608)  aspirin tablet 325 mg (325 mg Oral Given 09/13/22 1540)  morphine (PF) 4 MG/ML injection 4 mg (4 mg Intravenous Given 09/13/22 1608)  heparin bolus via infusion 4,000 Units (4,000 Units Intravenous Bolus from Bag 09/13/22 1613)    ED Course/ Medical Decision Making/ A&P                             Medical Decision Making Amount and/or Complexity of Data Reviewed Labs: ordered. Radiology: ordered.  Risk OTC drugs. Prescription drug management. Decision regarding hospitalization.   Initial Impression and Ddx 67 year old male is  well-appearing presenting for chest pain.  Exam is unremarkable.  DDx includes ACS, PE, aortic dissection, pneumonia, pneumothorax. Patient PMH that increases complexity of ED encounter:    Interpretation of Diagnostics -I independent reviewed and interpreted the labs as followed: Elevated troponin  - I independently visualized the following imaging with scope of interpretation limited to determining acute life threatening conditions related to emergency care: cxr, which revealed no acute cardiopulmonary process  -Personally reviewed and interpreted EKG which revealed ST and T wave abnormalities in the inferior leads  Patient Reassessment and Ultimate Disposition/Management The nature of his chest pain in combination with ST and T wave abnormalities on EKG along with elevated troponin concerning for NSTEMI.  Gave aspirin.  Spoke to Dr. Carolan Clines of cardiology who advised to admit to [facility] to telemetry bed, started heparin and give 50 mg metoprolol.  Vitals remained stable and patient continues to appear clinically well.  Plan will be to transfer to [facility] via EMS.  Patient management required  discussion with the following services or consulting groups:  Cardiology  Complexity of Problems Addressed Acute complicated illness or Injury  Additional Data Reviewed and Analyzed Further history obtained from: Further history from spouse/family member and Past medical history and medications listed in the EMR  Patient Encounter Risk Assessment Consideration of hospitalization         Final Clinical Impression(s) / ED Diagnoses Final diagnoses:  NSTEMI (non-ST elevated myocardial infarction) [facility])    Rx / DC Orders ED Discharge Orders     None         Gareth Eagle, PA-C 09/13/22 1619    Eber Hong, MD 09/13/22 1733

## 2022-09-13 NOTE — ED Notes (Signed)
Pt belongings went home with family except his cell phone.

## 2022-09-14 ENCOUNTER — Encounter (HOSPITAL_COMMUNITY)
Admission: EM | Disposition: A | Discharge status: Home / Self Care | Payer: Self-pay | Source: Home / Self Care | Attending: Internal Medicine

## 2022-09-14 ENCOUNTER — Inpatient Hospital Stay (HOSPITAL_COMMUNITY): Payer: 59

## 2022-09-14 DIAGNOSIS — I2510 Atherosclerotic heart disease of native coronary artery without angina pectoris: Secondary | ICD-10-CM | POA: Diagnosis not present

## 2022-09-14 DIAGNOSIS — R9431 Abnormal electrocardiogram [ECG] [EKG]: Secondary | ICD-10-CM

## 2022-09-14 DIAGNOSIS — I10 Essential (primary) hypertension: Secondary | ICD-10-CM | POA: Diagnosis not present

## 2022-09-14 DIAGNOSIS — I214 Non-ST elevation (NSTEMI) myocardial infarction: Secondary | ICD-10-CM | POA: Diagnosis not present

## 2022-09-14 DIAGNOSIS — E118 Type 2 diabetes mellitus with unspecified complications: Secondary | ICD-10-CM

## 2022-09-14 HISTORY — PX: CORONARY/GRAFT ACUTE MI REVASCULARIZATION: CATH118305

## 2022-09-14 HISTORY — PX: LEFT HEART CATH AND CORONARY ANGIOGRAPHY: CATH118249

## 2022-09-14 LAB — GLUCOSE, CAPILLARY
Glucose-Capillary: 133 mg/dL — ABNORMAL HIGH (ref 70–99)
Glucose-Capillary: 149 mg/dL — ABNORMAL HIGH (ref 70–99)
Glucose-Capillary: 145 mg/dL — ABNORMAL HIGH (ref 70–99)
Glucose-Capillary: 170 mg/dL — ABNORMAL HIGH (ref 70–99)

## 2022-09-14 LAB — BASIC METABOLIC PANEL
Anion gap: 11 (ref 5–15)
BUN: 19 mg/dL (ref 8–23)
CO2: 24 mmol/L (ref 22–32)
Calcium: 8.7 mg/dL — ABNORMAL LOW (ref 8.9–10.3)
Chloride: 101 mmol/L (ref 98–111)
Creatinine, Ser: 1.26 mg/dL — ABNORMAL HIGH (ref 0.61–1.24)
GFR, Estimated: >60 mL/min (ref >60)
Glucose, Bld: 132 mg/dL — ABNORMAL HIGH (ref 70–99)
Potassium: 3.3 mmol/L — ABNORMAL LOW (ref 3.5–5.1)
Sodium: 136 mmol/L (ref 135–145)

## 2022-09-14 LAB — CBC
HCT: 44.1 % (ref 39.0–52.0)
Hemoglobin: 15.2 g/dL (ref 13.0–17.0)
MCH: 28.7 pg (ref 26.0–34.0)
MCHC: 34.5 g/dL (ref 30.0–36.0)
MCV: 83.4 fL (ref 80.0–100.0)
Platelets: 186 10*3/uL (ref 150–400)
RBC: 5.29 MIL/uL (ref 4.22–5.81)
RDW: 13.3 % (ref 11.5–15.5)
WBC: 10.8 10*3/uL — ABNORMAL HIGH (ref 4.0–10.5)
nRBC: 0 % (ref 0.0–0.2)

## 2022-09-14 LAB — LIPID PANEL
Cholesterol: 144 mg/dL (ref 0–200)
HDL: 61 mg/dL (ref >40)
LDL Cholesterol: 76 mg/dL (ref 0–99)
Total CHOL/HDL Ratio: 2.4 RATIO
Triglycerides: 37 mg/dL (ref <150)
VLDL: 7 mg/dL (ref 0–40)

## 2022-09-14 LAB — BRAIN NATRIURETIC PEPTIDE: B Natriuretic Peptide: 69.4 pg/mL (ref 0.0–100.0)

## 2022-09-14 LAB — PROTIME-INR
INR: 1.1 (ref 0.8–1.2)
Prothrombin Time: 14.8 seconds (ref 11.4–15.2)

## 2022-09-14 LAB — POCT ACTIVATED CLOTTING TIME
Activated Clotting Time: 287 seconds
Activated Clotting Time: 352 seconds

## 2022-09-14 LAB — ECHOCARDIOGRAM COMPLETE
Area-P 1/2: 3.91 cm2
Height: 68 in
S' Lateral: 3.4 cm
Weight: 3096 oz

## 2022-09-14 LAB — HEPARIN LEVEL (UNFRACTIONATED): Heparin Unfractionated: 0.43 IU/mL (ref 0.30–0.70)

## 2022-09-14 LAB — HIV ANTIBODY (ROUTINE TESTING W REFLEX): HIV Screen 4th Generation wRfx: NONREACTIVE

## 2022-09-14 SURGERY — CORONARY/GRAFT ACUTE MI REVASCULARIZATION
Anesthesia: LOCAL

## 2022-09-14 MED ORDER — SODIUM CHLORIDE 0.9 % IV SOLN: INTRAVENOUS | Status: AC

## 2022-09-14 MED ORDER — SODIUM CHLORIDE 0.9% FLUSH: 3.0000 mL | INTRAVENOUS | Status: DC | PRN

## 2022-09-14 MED ORDER — FENTANYL CITRATE (PF) 100 MCG/2ML IJ SOLN
INTRAMUSCULAR | Status: DC | PRN
  Administered 2022-09-14: 25 ug via INTRAVENOUS

## 2022-09-14 MED ORDER — HEPARIN (PORCINE) IN NACL 1000-0.9 UT/500ML-% IV SOLN
INTRAVENOUS | Status: DC | PRN
  Administered 2022-09-14 (×2): 500 mL

## 2022-09-14 MED ORDER — HEPARIN SODIUM (PORCINE) 1000 UNIT/ML IJ SOLN
INTRAMUSCULAR | Status: AC
  Filled 2022-09-14: qty 10

## 2022-09-14 MED ORDER — SODIUM CHLORIDE 0.9 % IV SOLN: 250.0000 mL | INTRAVENOUS | Status: DC | PRN

## 2022-09-14 MED ORDER — TICAGRELOR 90 MG PO TABS
90.0000 mg | ORAL_TABLET | Freq: Two times a day (BID) | ORAL | Status: DC
  Administered 2022-09-14 – 2022-09-15 (×2): 90 mg via ORAL
  Filled 2022-09-14 (×2): qty 1

## 2022-09-14 MED ORDER — VERAPAMIL HCL 2.5 MG/ML IV SOLN
INTRAVENOUS | Status: DC | PRN
  Administered 2022-09-14: 10 mL via INTRA_ARTERIAL

## 2022-09-14 MED ORDER — DIAZEPAM 2 MG PO TABS: 5.0000 mg | ORAL_TABLET | Freq: Three times a day (TID) | ORAL | Status: DC | PRN

## 2022-09-14 MED ORDER — OXYCODONE HCL 5 MG PO TABS: 5.0000 mg | ORAL_TABLET | ORAL | Status: DC | PRN

## 2022-09-14 MED ORDER — VERAPAMIL HCL 2.5 MG/ML IV SOLN
INTRAVENOUS | Status: AC
  Filled 2022-09-14: qty 2

## 2022-09-14 MED ORDER — INSULIN ASPART 100 UNIT/ML IJ SOLN
0.0000 [IU] | Freq: Three times a day (TID) | INTRAMUSCULAR | Status: DC
  Administered 2022-09-14: 2 [IU] via SUBCUTANEOUS

## 2022-09-14 MED ORDER — POTASSIUM CHLORIDE CRYS ER 20 MEQ PO TBCR
40.0000 meq | EXTENDED_RELEASE_TABLET | Freq: Once | ORAL | Status: AC
  Administered 2022-09-14: 40 meq via ORAL
  Filled 2022-09-14: qty 2

## 2022-09-14 MED ORDER — LIDOCAINE HCL (PF) 1 % IJ SOLN
INTRAMUSCULAR | Status: DC | PRN
  Administered 2022-09-14: 5 mL via INTRADERMAL

## 2022-09-14 MED ORDER — TICAGRELOR 90 MG PO TABS
ORAL_TABLET | ORAL | Status: DC | PRN
  Administered 2022-09-14: 180 mg via ORAL

## 2022-09-14 MED ORDER — INSULIN ASPART 100 UNIT/ML IJ SOLN: 0.0000 [IU] | Freq: Every day | INTRAMUSCULAR | Status: DC

## 2022-09-14 MED ORDER — SODIUM CHLORIDE 0.9 % IV BOLUS
INTRAVENOUS | Status: AC | PRN
  Administered 2022-09-14: 500 mL via INTRAVENOUS

## 2022-09-14 MED ORDER — LIDOCAINE HCL (PF) 1 % IJ SOLN
INTRAMUSCULAR | Status: AC
  Filled 2022-09-14: qty 30

## 2022-09-14 MED ORDER — SODIUM CHLORIDE 0.9 % IV SOLN
INTRAVENOUS | Status: AC | PRN
  Administered 2022-09-14: 10 mL/h via INTRAVENOUS

## 2022-09-14 MED ORDER — HEPARIN (PORCINE) 25000 UT/250ML-% IV SOLN: 950.0000 [IU]/h | INTRAVENOUS | Status: DC

## 2022-09-14 MED ORDER — MIDAZOLAM HCL 2 MG/2ML IJ SOLN
INTRAMUSCULAR | Status: AC
  Filled 2022-09-14: qty 2

## 2022-09-14 MED ORDER — HEPARIN SODIUM (PORCINE) 1000 UNIT/ML IJ SOLN
INTRAMUSCULAR | Status: DC | PRN
  Administered 2022-09-14: 4000 [IU] via INTRAVENOUS
  Administered 2022-09-14: 3000 [IU] via INTRAVENOUS
  Administered 2022-09-14: 4000 [IU] via INTRAVENOUS

## 2022-09-14 MED ORDER — SODIUM CHLORIDE 0.9% FLUSH: 3.0000 mL | Freq: Two times a day (BID) | INTRAVENOUS | Status: DC

## 2022-09-14 MED ORDER — IOHEXOL 350 MG/ML SOLN
INTRAVENOUS | Status: DC | PRN
  Administered 2022-09-14: 170 mL

## 2022-09-14 MED ORDER — LABETALOL HCL 5 MG/ML IV SOLN: 10.0000 mg | INTRAVENOUS | Status: AC | PRN

## 2022-09-14 MED ORDER — FENTANYL CITRATE (PF) 100 MCG/2ML IJ SOLN
INTRAMUSCULAR | Status: AC
  Filled 2022-09-14: qty 2

## 2022-09-14 MED ORDER — SODIUM CHLORIDE 0.9% FLUSH
3.0000 mL | Freq: Two times a day (BID) | INTRAVENOUS | Status: DC
  Administered 2022-09-14 – 2022-09-15 (×2): 3 mL via INTRAVENOUS

## 2022-09-14 MED ORDER — MIDAZOLAM HCL 2 MG/2ML IJ SOLN
INTRAMUSCULAR | Status: DC | PRN
  Administered 2022-09-14: 2 mg via INTRAVENOUS

## 2022-09-14 MED ORDER — HYDRALAZINE HCL 20 MG/ML IJ SOLN: 10.0000 mg | INTRAMUSCULAR | Status: AC | PRN

## 2022-09-14 MED ORDER — TICAGRELOR 90 MG PO TABS
ORAL_TABLET | ORAL | Status: AC
  Filled 2022-09-14: qty 2

## 2022-09-14 MED ORDER — NITROGLYCERIN 1 MG/10 ML FOR IR/CATH LAB
INTRA_ARTERIAL | Status: DC | PRN
  Administered 2022-09-14 (×2): 100 ug via INTRACORONARY
  Administered 2022-09-14: 150 ug via INTRACORONARY

## 2022-09-14 SURGICAL SUPPLY — 24 items
BALL SAPPHIRE NC24 2.75X15 (BALLOONS) ×1
BALLN EMERGE MR 2.0X12 (BALLOONS) ×1
BALLN EMERGE MR 2.5X12 (BALLOONS) ×1
BALLN ~~LOC~~ EMERGE MR 3.75X15 (BALLOONS) ×1
BALLOON EMERGE MR 2.0X12 (BALLOONS) IMPLANT
BALLOON EMERGE MR 2.5X12 (BALLOONS) IMPLANT
BALLOON SAPPHIRE NC24 2.75X15 (BALLOONS) IMPLANT
BALLOON ~~LOC~~ EMERGE MR 3.75X15 (BALLOONS) IMPLANT
CATH 5FR JL3.5 JR4 ANG PIG MP (CATHETERS) IMPLANT
CATH LAUNCHER 6FR EBU3.5 (CATHETERS) IMPLANT
DEVICE RAD COMP TR BAND LRG (VASCULAR PRODUCTS) IMPLANT
GLIDESHEATH SLEND SS 6F .021 (SHEATH) IMPLANT
GUIDEWIRE INQWIRE 1.5J.035X260 (WIRE) IMPLANT
INQWIRE 1.5J .035X260CM (WIRE) ×1
KIT ENCORE 26 ADVANTAGE (KITS) IMPLANT
KIT HEART LEFT (KITS) ×1 IMPLANT
KIT HEMO VALVE WATCHDOG (MISCELLANEOUS) IMPLANT
PACK CARDIAC CATHETERIZATION (CUSTOM PROCEDURE TRAY) ×1 IMPLANT
STENT ONYX FRONTIER 2.5X26 (Permanent Stent) IMPLANT
STENT ONYX FRONTIER 3.5X18 (Permanent Stent) IMPLANT
TRANSDUCER W/STOPCOCK (MISCELLANEOUS) ×1 IMPLANT
TUBING CIL FLEX 10 FLL-RA (TUBING) ×1 IMPLANT
WIRE HI TORQ WHISPER MS 190CM (WIRE) IMPLANT
WIRE RUNTHROUGH .014X180CM (WIRE) IMPLANT

## 2022-09-14 NOTE — Plan of Care (Signed)

## 2022-09-14 NOTE — H&P (View-Only) (Signed)
Cardiology Progress Note  Patient ID: Stephen [name] MRN: [id number] DOB: [birth year] Date of Encounter: 09/14/2022  Primary Cardiologist: None  Subjective   Chief Complaint: Chest pain  HPI: Reports intermittent chest tightness.  Appears slightly volume up.  He did eat breakfast.  We discussed proceeding with left heart catheterization today.  ROS:  All other ROS reviewed and negative. Pertinent positives noted in the HPI.     Inpatient Medications  Scheduled Meds:  aspirin EC  81 mg Oral Daily   atorvastatin  80 mg Oral QHS   metoprolol tartrate  12.5 mg Oral BID   Continuous Infusions:  heparin 950 Units/hr (09/14/22 0700)   PRN Meds: acetaminophen, morphine injection, nitroGLYCERIN, ondansetron (ZOFRAN) IV   Vital Signs   Vitals:   09/13/22 1932 09/13/22 2343 09/14/22 0300 09/14/22 0729  BP: (!) 148/77 136/83 129/80 125/86  Pulse: 68 70 73 86  Resp: 20 17 19 17   Temp: 98.4 F (36.9 C) 100.3 F (37.9 C) (!) 100.4 F (38 C) 99.6 F (37.6 C)  TempSrc: Oral Oral Oral Oral  SpO2: 95% 95% 93% 94%  Weight:      Height:        Intake/Output Summary (Last 24 hours) at 09/14/2022 0802 Last data filed at 09/14/2022 0700 Gross per 24 hour  Intake 186.16 ml  Output --  Net 186.16 ml      09/13/2022    6:26 PM 09/13/2022    3:30 PM 10/07/2021    1:00 PM  Last 3 Weights  Weight (lbs) 193 lb 8 oz 190 lb 210 lb 12.2 oz  Weight (kg) 87.771 kg 86.183 kg 95.6 kg      Telemetry  Overnight telemetry shows SR 80s, which I personally reviewed.   ECG  The most recent ECG shows sinus rhythm heart rate 80, inferolateral T wave inversions, which I personally reviewed.   Physical Exam   Vitals:   09/13/22 1932 09/13/22 2343 09/14/22 0300 09/14/22 0729  BP: (!) 148/77 136/83 129/80 125/86  Pulse: 68 70 73 86  Resp: 20 17 19 17   Temp: 98.4 F (36.9 C) 100.3 F (37.9 C) (!) 100.4 F (38 C) 99.6 F (37.6 C)  TempSrc: Oral Oral Oral Oral  SpO2: 95% 95% 93% 94%  Weight:       Height:        Intake/Output Summary (Last 24 hours) at 09/14/2022 0802 Last data filed at 09/14/2022 0700 Gross per 24 hour  Intake 186.16 ml  Output --  Net 186.16 ml       09/13/2022    6:26 PM 09/13/2022    3:30 PM 10/07/2021    1:00 PM  Last 3 Weights  Weight (lbs) 193 lb 8 oz 190 lb 210 lb 12.2 oz  Weight (kg) 87.771 kg 86.183 kg 95.6 kg    Body mass index is 29.42 kg/m.  General: Well nourished, well developed, in no acute distress Head: Atraumatic, normal size  Eyes: PEERLA, EOMI  Neck: Supple, JVD 8-10 cmH2O Endocrine: No thryomegaly Cardiac: Normal S1, S2; RRR; no murmurs, rubs, or gallops Lungs: Clear to auscultation bilaterally, no wheezing, rhonchi or rales  Abd: Soft, nontender, no hepatomegaly  Ext: No edema, pulses 2+ Musculoskeletal: No deformities, BUE and BLE strength normal and equal Skin: Warm and dry, no rashes   Neuro: Alert and oriented to person, place, time, and situation, CNII-XII grossly intact, no focal deficits  Psych: Normal mood and affect   Labs  High Sensitivity Troponin:  Recent Labs  Lab 09/13/22 1440 09/13/22 1603  TROPONINIHS >24,000* >24,000*     Cardiac EnzymesNo results for input(s): "TROPONINI" in the last 168 hours. No results for input(s): "TROPIPOC" in the last 168 hours.  Chemistry Recent Labs  Lab 09/13/22 1440 09/14/22 0650  NA 135 136  K 3.4* 3.3*  CL 101 101  CO2 24 24  GLUCOSE 134* 132*  BUN 14 19  CREATININE 0.96 1.26*  CALCIUM 8.9 8.7*  GFRNONAA >60 >60  ANIONGAP 10 11    Hematology Recent Labs  Lab 09/13/22 1440 09/14/22 0650  WBC 8.1 10.8*  RBC 5.67 5.29  HGB 16.5 15.2  HCT 48.7 44.1  MCV 85.9 83.4  MCH 29.1 28.7  MCHC 33.9 34.5  RDW 13.1 13.3  PLT 201 186   BNPNo results for input(s): "BNP", "PROBNP" in the last 168 hours.  DDimer  Recent Labs  Lab 09/13/22 1512  DDIMER 0.39     Radiology  DG Chest 2 View  Result Date: 09/13/2022 CLINICAL DATA:  Chest pain EXAM: CHEST - 2 VIEW  COMPARISON:  Chest radiograph dated 01/15/2015 FINDINGS: Normal lung volumes. No focal consolidations. No pleural effusion or pneumothorax. The heart size and mediastinal contours are within normal limits. No acute osseous abnormality. IMPRESSION: No active cardiopulmonary disease. Electronically Signed   By: Agustin Cree M.D.   On: 09/13/2022 14:37    Cardiac Studies  Echo pending  Patient Profile  [name] is a 67 y.o. male with diabetes and hypertension who was admitted on 09/13/2022 for non-STEMI.  Assessment & Plan   # Non-STEMI # Concerns for congestive heart failure -Admitted with intermittent chest discomfort for the past 4 to 5 days.  Troponin is greater than 24,000.  Still describes intermittent chest tightness.  He has elevated JVD.  He did eat breakfast.  N.p.o. now.  We will proceed with left heart catheterization today.  I am quite concerned for electrically silent myocardial infarction. -Continue aspirin and heparin drip. -Lipitor 80 -Metoprolol to tartrate 12.5 mg twice daily. -Check A1c.  Lipids pending.  Check TSH.  Kidney function stable.  CBC stable.  LP(a) also pending. -Echo will be performed urgently this morning  # Diabetes -Check A1c.  Fine scale insulin.  # Hypertension -Hold home ARB and HCTZ.  On metoprolol.  Left heart catheterization today.  Further titration of medical therapy pending results of echo and cath.    Shared Decision Making/Informed Consent The risks [stroke (1 in 1000), death (1 in 1000), kidney failure [usually temporary] (1 in 500), bleeding (1 in 200), allergic reaction [possibly serious] (1 in 200)], benefits (diagnostic support and management of coronary artery disease) and alternatives of a cardiac catheterization were discussed in detail with Mr. Stall and he is willing to proceed.  FEN -No intravenous fluids -Code: Full -Diet: N.p.o. for left heart catheterization today -DVT PPx: Heparin drip   For questions or updates, please  contact [facility] Please consult [email or web address] for contact info under        Signed, Gerri Spore T. Flora Lipps, MD, [facility] [place]  [facility]  09/14/2022 8:02 AM

## 2022-09-14 NOTE — Progress Notes (Signed)
  Echocardiogram 2D Echocardiogram has been performed.  Delcie Roch 09/14/2022, 9:08 AM

## 2022-09-14 NOTE — Progress Notes (Signed)
Cardiology Progress Note  Patient ID: Stephen [name] MRN: [id number] DOB: 12/01/1955 Date of Encounter: 09/14/2022  Primary Cardiologist: None  Subjective   Chief Complaint: Chest pain  HPI: Reports intermittent chest tightness.  Appears slightly volume up.  He did eat breakfast.  We discussed proceeding with left heart catheterization today.  ROS:  All other ROS reviewed and negative. Pertinent positives noted in the HPI.     Inpatient Medications  Scheduled Meds:  aspirin EC  81 mg Oral Daily   atorvastatin  80 mg Oral QHS   metoprolol tartrate  12.5 mg Oral BID   Continuous Infusions:  heparin 950 Units/hr (09/14/22 0700)   PRN Meds: acetaminophen, morphine injection, nitroGLYCERIN, ondansetron (ZOFRAN) IV   Vital Signs   Vitals:   09/13/22 1932 09/13/22 2343 09/14/22 0300 09/14/22 0729  BP: (!) 148/77 136/83 129/80 125/86  Pulse: 68 70 73 86  Resp: 20 17 19 17  Temp: 98.4 F (36.9 C) 100.3 F (37.9 C) (!) 100.4 F (38 C) 99.6 F (37.6 C)  TempSrc: Oral Oral Oral Oral  SpO2: 95% 95% 93% 94%  Weight:      Height:        Intake/Output Summary (Last 24 hours) at 09/14/2022 0802 Last data filed at 09/14/2022 0700 Gross per 24 hour  Intake 186.16 ml  Output --  Net 186.16 ml      09/13/2022    6:26 PM 09/13/2022    3:30 PM 10/07/2021    1:00 PM  Last 3 Weights  Weight (lbs) 193 lb 8 oz 190 lb 210 lb 12.2 oz  Weight (kg) 87.771 kg 86.183 kg 95.6 kg      Telemetry  Overnight telemetry shows SR 80s, which I personally reviewed.   ECG  The most recent ECG shows sinus rhythm heart rate 80, inferolateral T wave inversions, which I personally reviewed.   Physical Exam   Vitals:   09/13/22 1932 09/13/22 2343 09/14/22 0300 09/14/22 0729  BP: (!) 148/77 136/83 129/80 125/86  Pulse: 68 70 73 86  Resp: 20 17 19 17  Temp: 98.4 F (36.9 C) 100.3 F (37.9 C) (!) 100.4 F (38 C) 99.6 F (37.6 C)  TempSrc: Oral Oral Oral Oral  SpO2: 95% 95% 93% 94%  Weight:       Height:        Intake/Output Summary (Last 24 hours) at 09/14/2022 0802 Last data filed at 09/14/2022 0700 Gross per 24 hour  Intake 186.16 ml  Output --  Net 186.16 ml       09/13/2022    6:26 PM 09/13/2022    3:30 PM 10/07/2021    1:00 PM  Last 3 Weights  Weight (lbs) 193 lb 8 oz 190 lb 210 lb 12.2 oz  Weight (kg) 87.771 kg 86.183 kg 95.6 kg    Body mass index is 29.42 kg/m.  General: Well nourished, well developed, in no acute distress Head: Atraumatic, normal size  Eyes: PEERLA, EOMI  Neck: Supple, JVD 8-10 cmH2O Endocrine: No thryomegaly Cardiac: Normal S1, S2; RRR; no murmurs, rubs, or gallops Lungs: Clear to auscultation bilaterally, no wheezing, rhonchi or rales  Abd: Soft, nontender, no hepatomegaly  Ext: No edema, pulses 2+ Musculoskeletal: No deformities, BUE and BLE strength normal and equal Skin: Warm and dry, no rashes   Neuro: Alert and oriented to person, place, time, and situation, CNII-XII grossly intact, no focal deficits  Psych: Normal mood and affect   Labs  High Sensitivity Troponin:     Recent Labs  Lab 09/13/22 1440 09/13/22 1603  TROPONINIHS >24,000* >24,000*     Cardiac EnzymesNo results for input(s): "TROPONINI" in the last 168 hours. No results for input(s): "TROPIPOC" in the last 168 hours.  Chemistry Recent Labs  Lab 09/13/22 1440 09/14/22 0650  NA 135 136  K 3.4* 3.3*  CL 101 101  CO2 24 24  GLUCOSE 134* 132*  BUN 14 19  CREATININE 0.96 1.26*  CALCIUM 8.9 8.7*  GFRNONAA >60 >60  ANIONGAP 10 11    Hematology Recent Labs  Lab 09/13/22 1440 09/14/22 0650  WBC 8.1 10.8*  RBC 5.67 5.29  HGB 16.5 15.2  HCT 48.7 44.1  MCV 85.9 83.4  MCH 29.1 28.7  MCHC 33.9 34.5  RDW 13.1 13.3  PLT 201 186   BNPNo results for input(s): "BNP", "PROBNP" in the last 168 hours.  DDimer  Recent Labs  Lab 09/13/22 1512  DDIMER 0.39     Radiology  DG Chest 2 View  Result Date: 09/13/2022 CLINICAL DATA:  Chest pain EXAM: CHEST - 2 VIEW  COMPARISON:  Chest radiograph dated 01/15/2015 FINDINGS: Normal lung volumes. No focal consolidations. No pleural effusion or pneumothorax. The heart size and mediastinal contours are within normal limits. No acute osseous abnormality. IMPRESSION: No active cardiopulmonary disease. Electronically Signed   By: Limin  Xu M.D.   On: 09/13/2022 14:37    Cardiac Studies  Echo pending  Patient Profile  Stephen [name] is a 66 y.o. male with diabetes and hypertension who was admitted on 09/13/2022 for non-STEMI.  Assessment & Plan   # Non-STEMI # Concerns for congestive heart failure -Admitted with intermittent chest discomfort for the past 4 to 5 days.  Troponin is greater than 24,000.  Still describes intermittent chest tightness.  He has elevated JVD.  He did eat breakfast.  N.p.o. now.  We will proceed with left heart catheterization today.  I am quite concerned for electrically silent myocardial infarction. -Continue aspirin and heparin drip. -Lipitor 80 -Metoprolol to tartrate 12.5 mg twice daily. -Check A1c.  Lipids pending.  Check TSH.  Kidney function stable.  CBC stable.  LP(a) also pending. -Echo will be performed urgently this morning  # Diabetes -Check A1c.  Fine scale insulin.  # Hypertension -Hold home ARB and HCTZ.  On metoprolol.  Left heart catheterization today.  Further titration of medical therapy pending results of echo and cath.    Shared Decision Making/Informed Consent The risks [stroke (1 in 1000), death (1 in 1000), kidney failure [usually temporary] (1 in 500), bleeding (1 in 200), allergic reaction [possibly serious] (1 in 200)], benefits (diagnostic support and management of coronary artery disease) and alternatives of a cardiac catheterization were discussed in detail with Stephen [name] and he is willing to proceed.  FEN -No intravenous fluids -Code: Full -Diet: N.p.o. for left heart catheterization today -DVT PPx: Heparin drip   For questions or updates, please  contact [facility] Please consult [email or web address] for contact info under        Signed, Arrey T. O'Neal, MD, FACC [place]  [facility]  09/14/2022 8:02 AM   

## 2022-09-14 NOTE — Progress Notes (Signed)
ANTICOAGULATION CONSULT NOTE  Pharmacy Consult for Heparin Indication: chest pain/ACS  No Known Allergies  Patient Measurements: Height: 5\' 8"  (172.7 cm) Weight: 87.8 kg (193 lb 8 oz) IBW/kg (Calculated) : 68.4 Heparin Dosing Weight: 85.7 kg  Vital Signs: Temp: 99.6 F (37.6 C) (05/19 0729) Temp Source: Oral (05/19 0729) BP: 125/86 (05/19 0729) Pulse Rate: 86 (05/19 0729)  Labs: Recent Labs    09/13/22 1440 09/13/22 1603 09/13/22 2329 09/14/22 0650  HGB 16.5  --   --  15.2  HCT 48.7  --   --  44.1  PLT 201  --   --  186  LABPROT  --   --   --  14.8  INR  --   --   --  1.1  HEPARINUNFRC  --   --  1.00* 0.43  CREATININE 0.96  --   --  1.26*  TROPONINIHS >24,000* >24,000*  --   --      Estimated Creatinine Clearance: 63.8 mL/min (A) (by C-G formula based on SCr of 1.26 mg/dL (H)).  Assessment: 66 yoM hx htn, hld admitted with CP and elevated troponins, pharmacy consulted to start systemic heparin. No contraindications noted.  Heparin level this morning is therapeutic at 0.43 after rate reduction early this AM.  No overt bleeding or complications noted.  Goal of Therapy:  Heparin level 0.3-0.7 units/ml Monitor platelets by anticoagulation protocol: Yes   Plan:  Continue IV heparin at current rate of 950 units/hr. Daily heparin level and CBC.  Reece Leader, Colon Flattery, BCCP Clinical Pharmacist  09/14/2022 10:32 AM   [facility] pharmacy phone numbers are listed on [email or web address]

## 2022-09-14 NOTE — Interval H&P Note (Signed)
History and Physical Interval Note:  09/14/2022 11:21 AM  Stephen [name]  has presented today for surgery, with the diagnosis of STEMI.  The various methods of treatment have been discussed with the patient and family. After consideration of risks, benefits and other options for treatment, the patient has consented to  Procedure(s): Coronary/Graft Acute MI Revascularization (N/A) LEFT HEART CATH AND CORONARY ANGIOGRAPHY (N/A) as a surgical intervention.  The patient's history has been reviewed, patient examined, no change in status, stable for surgery.  I have reviewed the patient's chart and labs.  Questions were answered to the patient's satisfaction.     Tonny Bollman

## 2022-09-14 NOTE — Progress Notes (Signed)
ANTICOAGULATION CONSULT NOTE  Pharmacy Consult for Heparin Indication: chest pain/ACS  No Known Allergies  Patient Measurements: Height: 5\' 8"  (172.7 cm) Weight: 87.8 kg (193 lb 8 oz) IBW/kg (Calculated) : 68.4 Heparin Dosing Weight: 85.7 kg  Vital Signs: Temp: 100.3 F (37.9 C) (05/18 2343) Temp Source: Oral (05/18 2343) BP: 136/83 (05/18 2343) Pulse Rate: 70 (05/18 2343)  Labs: Recent Labs    09/13/22 1440 09/13/22 1603 09/13/22 2329  HGB 16.5  --   --   HCT 48.7  --   --   PLT 201  --   --   HEPARINUNFRC  --   --  1.00*  CREATININE 0.96  --   --   TROPONINIHS >24,000* >24,000*  --      Estimated Creatinine Clearance: 83.8 mL/min (by C-G formula based on SCr of 0.96 mg/dL).  Assessment: 66 yoM hx htn, hld admitted with CP and elevated troponins, pharmacy consulted to start systemic heparin. No contraindications noted.  Heparin level is supratherapeutic at 1 on 1200 units/hr. Verified with RN, heparin level drawn from opposite arm as infusion. No bleeding noted.  Goal of Therapy:  Heparin level 0.3-0.7 units/ml Monitor platelets by anticoagulation protocol: Yes   Plan:  Hold heparin drip for 45 min Resume heparin drip at 950 units/hr around 00:50 6h heparin level Daily heparin level, CBC Monitor for s/sx of bleeding   Thank you for involving pharmacy in this patient's care.  Loura Back, PharmD, BCPS Clinical Pharmacist 09/14/2022 12:08 AM

## 2022-09-15 ENCOUNTER — Telehealth: Payer: Self-pay | Admitting: Cardiology

## 2022-09-15 ENCOUNTER — Other Ambulatory Visit (HOSPITAL_COMMUNITY): Payer: Self-pay

## 2022-09-15 ENCOUNTER — Telehealth (HOSPITAL_COMMUNITY): Payer: Self-pay

## 2022-09-15 ENCOUNTER — Encounter (HOSPITAL_COMMUNITY): Payer: Self-pay | Admitting: Cardiovascular Disease

## 2022-09-15 ENCOUNTER — Other Ambulatory Visit: Payer: Self-pay | Admitting: Cardiology

## 2022-09-15 DIAGNOSIS — R7989 Other specified abnormal findings of blood chemistry: Secondary | ICD-10-CM

## 2022-09-15 DIAGNOSIS — I214 Non-ST elevation (NSTEMI) myocardial infarction: Secondary | ICD-10-CM | POA: Diagnosis not present

## 2022-09-15 LAB — BASIC METABOLIC PANEL
Anion gap: 10 (ref 5–15)
BUN: 20 mg/dL (ref 8–23)
CO2: 22 mmol/L (ref 22–32)
Calcium: 8.2 mg/dL — ABNORMAL LOW (ref 8.9–10.3)
Chloride: 104 mmol/L (ref 98–111)
Creatinine, Ser: 1.33 mg/dL — ABNORMAL HIGH (ref 0.61–1.24)
GFR, Estimated: 60 mL/min — ABNORMAL LOW (ref >60)
Glucose, Bld: 141 mg/dL — ABNORMAL HIGH (ref 70–99)
Potassium: 3.7 mmol/L (ref 3.5–5.1)
Sodium: 136 mmol/L (ref 135–145)

## 2022-09-15 LAB — CBC
HCT: 40.5 % (ref 39.0–52.0)
Hemoglobin: 13.7 g/dL (ref 13.0–17.0)
MCH: 28.7 pg (ref 26.0–34.0)
MCHC: 33.8 g/dL (ref 30.0–36.0)
MCV: 84.9 fL (ref 80.0–100.0)
Platelets: 149 10*3/uL — ABNORMAL LOW (ref 150–400)
RBC: 4.77 MIL/uL (ref 4.22–5.81)
RDW: 13.3 % (ref 11.5–15.5)
WBC: 9.1 10*3/uL (ref 4.0–10.5)
nRBC: 0 % (ref 0.0–0.2)

## 2022-09-15 LAB — GLUCOSE, CAPILLARY
Glucose-Capillary: 109 mg/dL — ABNORMAL HIGH (ref 70–99)
Glucose-Capillary: 120 mg/dL — ABNORMAL HIGH (ref 70–99)

## 2022-09-15 LAB — TSH: TSH: 0.581 u[IU]/mL (ref 0.350–4.500)

## 2022-09-15 LAB — HEMOGLOBIN A1C
Hgb A1c MFr Bld: 6.4 % — ABNORMAL HIGH (ref 4.8–5.6)
Mean Plasma Glucose: 136.98 mg/dL

## 2022-09-15 MED ORDER — POTASSIUM CHLORIDE CRYS ER 20 MEQ PO TBCR
40.0000 meq | EXTENDED_RELEASE_TABLET | Freq: Once | ORAL | Status: AC
  Administered 2022-09-15: 40 meq via ORAL
  Filled 2022-09-15: qty 2

## 2022-09-15 MED ORDER — DAPAGLIFLOZIN PROPANEDIOL 10 MG PO TABS
10.0000 mg | ORAL_TABLET | Freq: Every day | ORAL | 11 refills | Status: DC
  Filled 2022-09-15 (×2): qty 30, 30d supply, fill #0

## 2022-09-15 MED ORDER — NITROGLYCERIN 0.4 MG SL SUBL
0.4000 mg | SUBLINGUAL_TABLET | SUBLINGUAL | 1 refills | Status: DC | PRN
  Filled 2022-09-15: qty 25, 5d supply, fill #0

## 2022-09-15 MED ORDER — METOPROLOL SUCCINATE ER 25 MG PO TB24
25.0000 mg | ORAL_TABLET | Freq: Every day | ORAL | 11 refills | Status: DC
  Filled 2022-09-15: qty 30, 30d supply, fill #0

## 2022-09-15 MED ORDER — TICAGRELOR 90 MG PO TABS
90.0000 mg | ORAL_TABLET | Freq: Two times a day (BID) | ORAL | 11 refills | Status: DC
  Filled 2022-09-15 (×3): qty 60, 30d supply, fill #0

## 2022-09-15 MED ORDER — ASPIRIN 81 MG PO TBEC
81.0000 mg | DELAYED_RELEASE_TABLET | Freq: Every day | ORAL | 2 refills | Status: AC
  Filled 2022-09-15: qty 120, 120d supply, fill #0

## 2022-09-15 MED ORDER — ATORVASTATIN CALCIUM 80 MG PO TABS
80.0000 mg | ORAL_TABLET | Freq: Every day | ORAL | 3 refills | Status: DC
  Filled 2022-09-15: qty 30, 30d supply, fill #0

## 2022-09-15 MED ORDER — EMPAGLIFLOZIN 10 MG PO TABS
10.0000 mg | ORAL_TABLET | Freq: Every day | ORAL | 11 refills | Status: DC
  Filled 2022-09-15: qty 30, 30d supply, fill #0

## 2022-09-15 MED ORDER — METFORMIN HCL 500 MG PO TABS: 500.0000 mg | ORAL_TABLET | Freq: Every day | ORAL | Status: AC

## 2022-09-15 NOTE — Telephone Encounter (Signed)
Pharmacy Patient Advocate Encounter  Insurance verification completed.    The patient is insured through [employer]   The patient is currently admitted and ran test claims for the following: Brilinta, Jardiance, Farxiga.  Copays and coinsurance results were relayed to Inpatient clinical team.

## 2022-09-15 NOTE — Progress Notes (Addendum)
[facility] filled scripts for pt, meds delivered to pt's room. Pt ready for discharge per pt's primary nurse, Lelon Mast. Instructions already reviewed and pt has a copy.  Pt's ride on their way, pt appropriate for discharge lounge. Pt to be taken to lounge via wheelchair with belongings while he waits for ride.   Annice Needy, RN SWOT

## 2022-09-15 NOTE — Progress Notes (Signed)
CARDIAC REHAB PHASE I   PRE:  Rate/Rhythm: 83 SR    BP: sitting 125/69    SpO2: 95 RA  MODE:  Ambulation: 340 ft   POST:  Rate/Rhythm: 95 SR    BP: sitting 126/73     SpO2: 97 RA  Tolerated well, no c/o. Discussed with pt MI, stents, Brilinta importance, restrictions, diet, exercise, NTG and CRPII. Pt very receptive. Plans to tighten up on diet. Already exercises but will try to add 1-2 days a week. Will refer to [facility] CRPII.  [id number]   Ethelda Chick BS, ACSM-CEP 09/15/2022 9:15 AM

## 2022-09-15 NOTE — TOC Transition Note (Signed)
Transition of Care [facility]) - CM/SW Discharge Note   Patient Details  Name: Stephen [name] MRN: [id number] Date of Birth: 11/19/1955  Transition of Care [facility]) CM/SW Contact:  Harriet Masson, RN Phone Number: 09/15/2022, 2:24 PM   Clinical Narrative:     Patient stable for discharge.  Patient to follow up with MD 09/25/22. No other TOC needs.  Final next level of care: Home/Self Care Barriers to Discharge: Barriers Resolved   Patient Goals and CMS Choice    Return home  Discharge Placement               home          Discharge Plan and Services Additional resources added to the After Visit Summary for                                       Social Determinants of Health (SDOH) Interventions SDOH Screenings   Tobacco Use: Medium Risk (09/15/2022)     Readmission Risk Interventions    09/15/2022    2:24 PM  Readmission Risk Prevention Plan  Post Dischage Appt Complete  Medication Screening Complete  Transportation Screening Complete

## 2022-09-15 NOTE — Telephone Encounter (Signed)
Patient contacted regarding discharge from [facility] on 5/202/2024.  Patient understands to follow up with provider Stephen [name] on 09/25/2022 at 2:20pm at [facility]. Patient understands discharge instructions? Yes Patient understands medications and regiment? No. We discussed all medications in detail what he is supposed to stop taking. His new medications, when to start them, dosage and times. He stated he was writing everything down. He repeated medication instructions back to me. He verbalized understanding at the end of phone call.  Patient understands to bring all medications to this visit? Yes  Ask patient:  Are you enrolled in My Chart yes

## 2022-09-15 NOTE — Progress Notes (Signed)
Rounding Note    Patient Name: Stephen [name] Date of Encounter: 09/15/2022  [facility] Cardiologist: Dr. Dina Rich   Subjective   No chest pain or shortness of breath today.  Just got back from walking with cardiac rehab and was asymptomatic.  Inpatient Medications    Scheduled Meds:  aspirin EC  81 mg Oral Daily   atorvastatin  80 mg Oral QHS   insulin aspart  0-15 Units Subcutaneous TID WC   insulin aspart  0-5 Units Subcutaneous QHS   metoprolol tartrate  12.5 mg Oral BID   sodium chloride flush  3 mL Intravenous Q12H   ticagrelor  90 mg Oral BID   Continuous Infusions:  sodium chloride     PRN Meds: sodium chloride, acetaminophen, diazepam, morphine injection, nitroGLYCERIN, ondansetron (ZOFRAN) IV, oxyCODONE, sodium chloride flush   Vital Signs    Vitals:   09/14/22 1940 09/14/22 2321 09/15/22 0350 09/15/22 0802  BP: 118/83 123/64 120/72 125/69  Pulse: 79 76 79 84  Resp: 20 20 19 18   Temp: 100.2 F (37.9 C) 99.3 F (37.4 C) 99.7 F (37.6 C) 98.7 F (37.1 C)  TempSrc: Oral Oral Oral Oral  SpO2: 96% 98% 95% 96%  Weight:      Height:        Intake/Output Summary (Last 24 hours) at 09/15/2022 0842 Last data filed at 09/15/2022 0802 Gross per 24 hour  Intake 240 ml  Output 900 ml  Net -660 ml      09/13/2022    6:26 PM 09/13/2022    3:30 PM 10/07/2021    1:00 PM  Last 3 Weights  Weight (lbs) 193 lb 8 oz 190 lb 210 lb 12.2 oz  Weight (kg) 87.771 kg 86.183 kg 95.6 kg      Telemetry    Sinus rhythm- Personally Reviewed  ECG    Sinus rhythm at 80 with inferolateral T wave inversion- Personally Reviewed  Physical Exam   GEN: No acute distress.   Neck: No JVD Cardiac: RRR, no murmurs, rubs, or gallops.  Respiratory: Clear to auscultation bilaterally. GI: Soft, nontender, non-distended  MS: No edema; No deformity. Neuro:  Nonfocal  Psych: Normal affect   Labs    High Sensitivity Troponin:   Recent Labs  Lab  09/13/22 1440 09/13/22 1603  TROPONINIHS >24,000* >24,000*     Chemistry Recent Labs  Lab 09/13/22 1440 09/14/22 0650 09/15/22 0011  NA 135 136 136  K 3.4* 3.3* 3.7  CL 101 101 104  CO2 24 24 22   GLUCOSE 134* 132* 141*  BUN 14 19 20   CREATININE 0.96 1.26* 1.33*  CALCIUM 8.9 8.7* 8.2*  GFRNONAA >60 >60 60*  ANIONGAP 10 11 10     Lipids  Recent Labs  Lab 09/14/22 0650  CHOL 144  TRIG 37  HDL 61  LDLCALC 76  CHOLHDL 2.4    Hematology Recent Labs  Lab 09/13/22 1440 09/14/22 0650 09/15/22 0406  WBC 8.1 10.8* 9.1  RBC 5.67 5.29 4.77  HGB 16.5 15.2 13.7  HCT 48.7 44.1 40.5  MCV 85.9 83.4 84.9  MCH 29.1 28.7 28.7  MCHC 33.9 34.5 33.8  RDW 13.1 13.3 13.3  PLT 201 186 149*   Thyroid  Recent Labs  Lab 09/15/22 0011  TSH 0.581    BNP Recent Labs  Lab 09/14/22 0725  BNP 69.4    DDimer  Recent Labs  Lab 09/13/22 1512  DDIMER 0.39     Radiology  CARDIAC CATHETERIZATION  Result Date: 09/14/2022   Prox RCA lesion is 40% stenosed.   1st Mrg lesion is 100% stenosed.   Prox LAD to Mid LAD lesion is 90% stenosed.   A drug-eluting stent was successfully placed using a STENT ONYX FRONTIER 2.5X26.   A drug-eluting stent was successfully placed using a STENT ONYX FRONTIER 3.5X18.   Post intervention, there is a 0% residual stenosis.   Post intervention, there is a 0% residual stenosis. 1.  Severe two-vessel coronary artery disease with total occlusion of the first OM branch of the circumflex and severe stenosis of the proximal to mid LAD, both lesions treated with PCI using a single drug-eluting stent at each lesion site 2.  Patent RCA with mild nonobstructive disease 3.  Normal LVEF by echo assessment with no regional wall motion abnormalities, normal LVEDP at cardiac catheterization Recommendations: Aggressive medical therapy, DAPT with aspirin and ticagrelor without interruption for 12 months, if no complications arise should be appropriate for hospital discharge  tomorrow morning.   ECHOCARDIOGRAM COMPLETE  Result Date: 09/14/2022    ECHOCARDIOGRAM REPORT   Patient Name:   Stephen [name] Date of Exam: 09/14/2022 Medical Rec #:  [id number]      Height:       68.0 in Accession #:    [id number]     Weight:       193.5 lb Date of Birth:  August 08, 1955       BSA:          2.015 m Patient Age:    66 years       BP:           125/86 mmHg Patient Gender: M              HR:           76 bpm. Exam Location:  Inpatient Procedure: Strain Analysis, 2D Echo, Color Doppler and Cardiac Doppler Indications:    abnormal ecg  History:        Patient has no prior history of Echocardiogram examinations.                 Signs/Symptoms:Chest Pain; Risk Factors:Diabetes and                 Hypertension.  Sonographer:    Delcie Roch RDCS Referring Phys: [id number] [facility] S KHAN  Sonographer Comments: Global longitudinal strain was attempted. IMPRESSIONS  1. Left ventricular ejection fraction, by estimation, is 60 to 65%. The left ventricle has normal function. The left ventricle has no regional wall motion abnormalities. There is moderate concentric left ventricular hypertrophy. Left ventricular diastolic parameters are consistent with Grade I diastolic dysfunction (impaired relaxation).  2. Right ventricular systolic function is normal. The right ventricular size is normal. Tricuspid regurgitation signal is inadequate for assessing PA pressure.  3. The mitral valve is normal in structure. No evidence of mitral valve regurgitation.  4. The aortic valve is tricuspid. Aortic valve regurgitation is not visualized.  5. The inferior vena cava is normal in size with greater than 50% respiratory variability, suggesting right atrial pressure of 3 mmHg. Conclusion(s)/Recommendation(s): Normal biventricular function without evidence of hemodynamically significant valvular heart disease. FINDINGS  Left Ventricle: Left ventricular ejection fraction, by estimation, is 60 to 65%. The left ventricle has normal  function. The left ventricle has no regional wall motion abnormalities. The left ventricular internal cavity size was normal in size. There is  moderate concentric left ventricular hypertrophy. Left ventricular diastolic parameters  are consistent with Grade I diastolic dysfunction (impaired relaxation). Right Ventricle: The right ventricular size is normal. Right ventricular systolic function is normal. Tricuspid regurgitation signal is inadequate for assessing PA pressure. Left Atrium: Left atrial size was normal in size. Right Atrium: Right atrial size was normal in size. Pericardium: There is no evidence of pericardial effusion. Mitral Valve: The mitral valve is normal in structure. No evidence of mitral valve regurgitation. Tricuspid Valve: Tricuspid valve regurgitation is not demonstrated. Aortic Valve: The aortic valve is tricuspid. Aortic valve regurgitation is not visualized. Pulmonic Valve: Pulmonic valve regurgitation is not visualized. Aorta: The aortic root and ascending aorta are structurally normal, with no evidence of dilitation. Venous: The inferior vena cava is normal in size with greater than 50% respiratory variability, suggesting right atrial pressure of 3 mmHg. IAS/Shunts: No atrial level shunt detected by color flow Doppler.  LEFT VENTRICLE PLAX 2D LVIDd:         4.40 cm   Diastology LVIDs:         3.40 cm   LV e' medial:    4.90 cm/s LV PW:         1.50 cm   LV E/e' medial:  14.2 LV IVS:        1.50 cm   LV e' lateral:   7.07 cm/s LVOT diam:     2.10 cm   LV E/e' lateral: 9.8 LV SV:         63 LV SV Index:   31        2D Longitudinal Strain LVOT Area:     3.46 cm  2D Strain GLS Avg:     -14.0 %  RIGHT VENTRICLE RV Basal diam:  2.60 cm RV S prime:     12.80 cm/s TAPSE (M-mode): 1.6 cm LEFT ATRIUM             Index        RIGHT ATRIUM           Index LA diam:        3.80 cm 1.89 cm/m   RA Area:     10.50 cm LA Vol (A2C):   30.4 ml 15.09 ml/m  RA Volume:   19.90 ml  9.87 ml/m LA Vol (A4C):    28.3 ml 14.04 ml/m LA Biplane Vol: 30.9 ml 15.33 ml/m  AORTIC VALVE LVOT Vmax:   110.00 cm/s LVOT Vmean:  65.500 cm/s LVOT VTI:    0.181 m  AORTA Ao Root diam: 3.70 cm Ao Asc diam:  3.00 cm MITRAL VALVE MV Area (PHT): 3.91 cm    SHUNTS MV Decel Time: 194 msec    Systemic VTI:  0.18 m MV E velocity: 69.50 cm/s  Systemic Diam: 2.10 cm MV A velocity: 91.30 cm/s MV E/A ratio:  0.76 [place] signed by Carolan Clines Signature Date/Time: 09/14/2022/10:35:11 AM    Final    DG Chest 2 View  Result Date: 09/13/2022 CLINICAL DATA:  Chest pain EXAM: CHEST - 2 VIEW COMPARISON:  Chest radiograph dated 01/15/2015 FINDINGS: Normal lung volumes. No focal consolidations. No pleural effusion or pneumothorax. The heart size and mediastinal contours are within normal limits. No acute osseous abnormality. IMPRESSION: No active cardiopulmonary disease. Electronically Signed   By: Agustin Cree M.D.   On: 09/13/2022 14:37    Cardiac Studies   2D echo (09/14/2022)  IMPRESSIONS     1. Left ventricular ejection fraction, by estimation, is 60 to 65%. The  left ventricle  has normal function. The left ventricle has no regional  wall motion abnormalities. There is moderate concentric left ventricular  hypertrophy. Left ventricular  diastolic parameters are consistent with Grade I diastolic dysfunction  (impaired relaxation).   2. Right ventricular systolic function is normal. The right ventricular  size is normal. Tricuspid regurgitation signal is inadequate for assessing  PA pressure.   3. The mitral valve is normal in structure. No evidence of mitral valve  regurgitation.   4. The aortic valve is tricuspid. Aortic valve regurgitation is not  visualized.   5. The inferior vena cava is normal in size with greater than 50%  respiratory variability, suggesting right atrial pressure of 3 mmHg.   Conclusion(s)/Recommendation(s): Normal biventricular function without  evidence of hemodynamically significant  valvular heart disease.    Cardiac catheterization/PCI and stent (09/14/2022)  Conclusion      Prox RCA lesion is 40% stenosed.   1st Mrg lesion is 100% stenosed.   Prox LAD to Mid LAD lesion is 90% stenosed.   A drug-eluting stent was successfully placed using a STENT ONYX FRONTIER 2.5X26.   A drug-eluting stent was successfully placed using a STENT ONYX FRONTIER 3.5X18.   Post intervention, there is a 0% residual stenosis.   Post intervention, there is a 0% residual stenosis.   1.  Severe two-vessel coronary artery disease with total occlusion of the first OM branch of the circumflex and severe stenosis of the proximal to mid LAD, both lesions treated with PCI using a single drug-eluting stent at each lesion site 2.  Patent RCA with mild nonobstructive disease 3.  Normal LVEF by echo assessment with no regional wall motion abnormalities, normal LVEDP at cardiac catheterization   Recommendations: Aggressive medical therapy, DAPT with aspirin and ticagrelor without interruption for 12 months, if no complications arise should be appropriate for hospital discharge tomorrow morning.  Intervention       Patient Profile   Stephen [name] is a 67 y.o. male with diabetes and hypertension who was admitted on 09/13/2022 for non-STEMI.   Assessment & Plan    1: Non-STEMI-chest pain for 4 to 5 days prior to admission.  Troponin greater than 24,000.  Cardiac catheterization revealed an occluded OM and high-grade proximal to mid LAD both of which were stented with drug-eluting stents by Dr. Excell Seltzer yesterday with excellent angiographic result.  He is on DAPT with aspirin and ticagrelor which will need to be continued uninterrupted for 12 months.  He is remained pain-free this morning.  Right radial puncture site is stable.  2: Type 2 diabetes-on sliding scale insulin here in the hospital  3: Essential hypertension-blood pressure stable only on metoprolol here in the hospital.  4:  Hyperlipidemia-on high-dose atorvastatin.  Total cholesterol was 144 with an LDL of 76 on admission.  Patient stable postop day 1 two-vessel PCI and stenting.  Can be discharged home later today on DAPT.  TOC 7 after which she can see Dr. Dina Rich in 4 to 6 weeks in [place] for follow-up . [facility] will sign off.   Medication Recommendations: DAPT, beta-blocker Other recommendations (labs, testing, etc): None Follow up as an outpatient: TOC 7, Dr. Dina Rich in 4 to 6 weeks.  For questions or updates, please contact [facility] Please consult [email or web address] for contact info under        Signed, Nanetta Batty, MD  09/15/2022, 8:42 AM

## 2022-09-15 NOTE — Telephone Encounter (Signed)
   Transition of Care Follow-up Phone Call Request    Patient Name: Stephen [name] Date of Birth: [birth year] Date of Encounter: 09/15/2022  Primary Care Provider:  Georgann Housekeeper, MD Primary Cardiologist:  None  Stephen [name] has been scheduled for a transition of care follow up appointment with a [facility] provider:  Helayne Seminole PA-C on 09/25/22 at 2:20pm.   Please reach out to Stephen [name] within 48 hours to confirm appointment and review transition of care protocol questionnaire.  Perlie Gold, PA-C  09/15/2022, 11:29 AM

## 2022-09-15 NOTE — Plan of Care (Signed)
  Problem: Health Behavior/Discharge Planning: Goal: Ability to manage health-related needs will improve Outcome: Progressing   Problem: Clinical Measurements: Goal: Respiratory complications will improve Outcome: Progressing   Problem: Activity: Goal: Risk for activity intolerance will decrease Outcome: Progressing   Problem: Nutrition: Goal: Adequate nutrition will be maintained Outcome: Progressing   Problem: Coping: Goal: Level of anxiety will decrease Outcome: Progressing   Problem: Elimination: Goal: Will not experience complications related to bowel motility Outcome: Progressing Goal: Will not experience complications related to urinary retention Outcome: Progressing

## 2022-09-15 NOTE — TOC Benefit Eligibility Note (Signed)
Patient Product/process development scientist completed.    The patient is currently admitted and upon discharge could be taking [place].  The current 30 day co-pay is $556.27 (due to deductible).   The patient is currently admitted and upon discharge could be taking Jardiance.  The current 30 day co-pay is $583.82 (due to high deductible).   The patient is currently admitted and upon discharge could be taking Brilinta.  The current 30 day co-pay is NA, "USE CLOPIDOGREL, PRASUGREL, OR EXC.   The patient is insured through [employer]   This test claim was processed through [facility]- copay amounts may vary at other pharmacies due to pharmacy/plan contracts, or as the patient moves through the different stages of their insurance plan.      \

## 2022-09-15 NOTE — Progress Notes (Signed)
  Transition of Care [facility]) Screening Note   Patient Details  Name: Stephen [name] Date of Birth: 1955/08/28   Transition of Care [facility]) CM/SW Contact:    Harriet Masson, RN Phone Number: 09/15/2022, 12:50 PM    Transition of Care Department [facility]) has reviewed patient and no TOC needs have been identified at this time. We will continue to monitor patient advancement through interdisciplinary progression rounds. If new patient transition needs arise, please place a TOC consult.

## 2022-09-15 NOTE — Discharge Summary (Addendum)
Discharge Summary    Patient ID: Stephen [name] MRN: [id number]; DOB: Jun 10, 1955  Admit date: 09/13/2022 Discharge date: 09/15/2022  PCP:  Georgann Housekeeper, MD   [facility] Providers Cardiologist:  None        Discharge Diagnoses    Principal Problem:   NSTEMI (non-ST elevated myocardial infarction) [facility]) Active Problems:   Hypertension   Type 2 diabetes mellitus with complication, without long-term current use of insulin (HCC)    Diagnostic Studies/Procedures    09/14/22 TTE  IMPRESSIONS     1. Left ventricular ejection fraction, by estimation, is 60 to 65%. The  left ventricle has normal function. The left ventricle has no regional  wall motion abnormalities. There is moderate concentric left ventricular  hypertrophy. Left ventricular  diastolic parameters are consistent with Grade I diastolic dysfunction  (impaired relaxation).   2. Right ventricular systolic function is normal. The right ventricular  size is normal. Tricuspid regurgitation signal is inadequate for assessing  PA pressure.   3. The mitral valve is normal in structure. No evidence of mitral valve  regurgitation.   4. The aortic valve is tricuspid. Aortic valve regurgitation is not  visualized.   5. The inferior vena cava is normal in size with greater than 50%  respiratory variability, suggesting right atrial pressure of 3 mmHg.   Conclusion(s)/Recommendation(s): Normal biventricular function without  evidence of hemodynamically significant valvular heart disease.   FINDINGS   Left Ventricle: Left ventricular ejection fraction, by estimation, is 60  to 65%. The left ventricle has normal function. The left ventricle has no  regional wall motion abnormalities. The left ventricular internal cavity  size was normal in size. There is   moderate concentric left ventricular hypertrophy. Left ventricular  diastolic parameters are consistent with Grade I diastolic dysfunction  (impaired  relaxation).   Right Ventricle: The right ventricular size is normal. Right ventricular  systolic function is normal. Tricuspid regurgitation signal is inadequate  for assessing PA pressure.   Left Atrium: Left atrial size was normal in size.   Right Atrium: Right atrial size was normal in size.   Pericardium: There is no evidence of pericardial effusion.   Mitral Valve: The mitral valve is normal in structure. No evidence of  mitral valve regurgitation.   Tricuspid Valve: Tricuspid valve regurgitation is not demonstrated.   Aortic Valve: The aortic valve is tricuspid. Aortic valve regurgitation is  not visualized.   Pulmonic Valve: Pulmonic valve regurgitation is not visualized.   Aorta: The aortic root and ascending aorta are structurally normal, with  no evidence of dilitation.   Venous: The inferior vena cava is normal in size with greater than 50%  respiratory variability, suggesting right atrial pressure of 3 mmHg.   IAS/Shunts: No atrial level shunt detected by color flow Doppler.   09/14/22 LHC    Prox RCA lesion is 40% stenosed.   1st Mrg lesion is 100% stenosed.   Prox LAD to Mid LAD lesion is 90% stenosed.   A drug-eluting stent was successfully placed using a STENT ONYX FRONTIER 2.5X26.   A drug-eluting stent was successfully placed using a STENT ONYX FRONTIER 3.5X18.   Post intervention, there is a 0% residual stenosis.   Post intervention, there is a 0% residual stenosis.   1.  Severe two-vessel coronary artery disease with total occlusion of the first OM branch of the circumflex and severe stenosis of the proximal to mid LAD, both lesions treated with PCI using a single drug-eluting  stent at each lesion site 2.  Patent RCA with mild nonobstructive disease 3.  Normal LVEF by echo assessment with no regional wall motion abnormalities, normal LVEDP at cardiac catheterization   Recommendations: Aggressive medical therapy, DAPT with aspirin and ticagrelor without  interruption for 12 months.   Intervention       _____________   History of Present Illness     Stephen [name] is a 67 y.o. male with history of HTN, DM and remote hx of prostate cancer who was admitted 09/13/2022 for the evaluation of NSTEMI. Patient reported having chest pain with diaphoresis 2-3 days prior to admission. Pain recurred again on 5/17 and patient presented to the ED for further evaluation.   Hospital Course     Consultants: n/a  NSTEMI  In the ED, patient found with ST depressions and T wave inversions in inferior and lateral leads. Troponin >24000 x2. Patient was placed on heparin and transferred to [facility] for further evaluation. On 5/19, patient continued to report intermittent chest tightness and there was concern of electrically silen MI. Patient underwent urgent echo and then [facility] with Dr. Excell Seltzer. TTE showed LVEF 60-65% with normal wall motion and grade I diastolic dysfunction. LHC with severe two vessel CAD with total occlusion of first OM branch of LCX and severe stenosis of proximal to mid LAD. Patient treated successfully with DES x2. He will discharge on the following GDMT:  DAPT x12 months. Will leave with ASA 81mg  and Brilinta 90mg  BID. Patient's insurance with poor Brilinta coverage after one month. Prasugrel $9 per pharmacy review. Given that he has already loaded Brilinta, will plan outpatient transition to Prasugrel in 1 month. Per Dr. Excell Seltzer, would load with 30mg  Prasugrel at that time. Metoprolol Succinate 25mg  Lipitor 80mg   PRN nitroglycerin  Hyperlipidemia  LDL goal <70 (currently 76). Lipoprotein A is pending. Patient to discharge on Atorvastatin 80mg .   Hypertension  Patient admitted on Losartan/HCTZ combination. BP at goal to slightly elevated with these held. Given slightly elevated creatinine on day of discharge, will  hold Losartan/HCTZ and discharge on Metoprolol Succinate 25mg . If BP elevated at follow up, would resume Losartan only.    DM type II  Patient with A1c 6.4. Will add Farxiga 10mg  given CAD with DM. Patient can resume home Metformin 500mg  on 5/22.  Patient seen by Dr. Allyson Sabal on day of discharge and deemed stable for discharge home.       Did the patient have an acute coronary syndrome (MI, NSTEMI, STEMI, etc) this admission?:  Yes                               AHA/ACC Clinical Performance & Quality Measures: Aspirin prescribed? - Yes ADP Receptor Inhibitor (Plavix/Clopidogrel, Brilinta/Ticagrelor or Effient/Prasugrel) prescribed (includes medically managed patients)? - Yes Beta Blocker prescribed? - Yes High Intensity Statin (Lipitor 40-80mg  or Crestor 20-40mg ) prescribed? - Yes EF assessed during THIS hospitalization? - Yes For EF <40%, was ACEI/ARB prescribed? - Not Applicable (EF >/= 40%) For EF <40%, Aldosterone Antagonist (Spironolactone or Eplerenone) prescribed? - Not Applicable (EF >/= 40%) Cardiac Rehab Phase II ordered (including medically managed patients)? - Yes       The patient will be scheduled for a TOC follow up appointment in 14 days.  A message has been sent to the [facility] and Scheduling Pool at the office where the patient should be seen for follow up.  _____________  Discharge Vitals  Blood pressure 111/75, pulse 71, temperature 98.7 F (37.1 C), temperature source Oral, resp. rate 18, height 5\' 8"  (1.727 m), weight 87.8 kg, SpO2 97 %.  Filed Weights   09/13/22 1530 09/13/22 1826  Weight: 86.2 kg 87.8 kg    Labs & Radiologic Studies    CBC Recent Labs    09/14/22 0650 09/15/22 0406  WBC 10.8* 9.1  HGB 15.2 13.7  HCT 44.1 40.5  MCV 83.4 84.9  PLT 186 149*   Basic Metabolic Panel Recent Labs    16/10/96 0650 09/15/22 0011  NA 136 136  K 3.3* 3.7  CL 101 104  CO2 24 22  GLUCOSE 132* 141*  BUN 19 20  CREATININE 1.26* 1.33*  CALCIUM 8.7* 8.2*   Liver Function Tests No results for input(s): "AST", "ALT", "ALKPHOS", "BILITOT", "PROT", "ALBUMIN" in the last 72  hours. No results for input(s): "LIPASE", "AMYLASE" in the last 72 hours. High Sensitivity Troponin:   Recent Labs  Lab 09/13/22 1440 09/13/22 1603  TROPONINIHS >24,000* >24,000*    BNP Invalid input(s): "POCBNP" D-Dimer Recent Labs    09/13/22 1512  DDIMER 0.39   Hemoglobin A1C Recent Labs    09/15/22 0406  HGBA1C 6.4*   Fasting Lipid Panel Recent Labs    09/14/22 0650  CHOL 144  HDL 61  LDLCALC 76  TRIG 37  CHOLHDL 2.4   Thyroid Function Tests Recent Labs    09/15/22 0011  TSH 0.581   _____________  CARDIAC CATHETERIZATION  Result Date: 09/14/2022   Prox RCA lesion is 40% stenosed.   1st Mrg lesion is 100% stenosed.   Prox LAD to Mid LAD lesion is 90% stenosed.   A drug-eluting stent was successfully placed using a STENT ONYX FRONTIER 2.5X26.   A drug-eluting stent was successfully placed using a STENT ONYX FRONTIER 3.5X18.   Post intervention, there is a 0% residual stenosis.   Post intervention, there is a 0% residual stenosis. 1.  Severe two-vessel coronary artery disease with total occlusion of the first OM branch of the circumflex and severe stenosis of the proximal to mid LAD, both lesions treated with PCI using a single drug-eluting stent at each lesion site 2.  Patent RCA with mild nonobstructive disease 3.  Normal LVEF by echo assessment with no regional wall motion abnormalities, normal LVEDP at cardiac catheterization Recommendations: Aggressive medical therapy, DAPT with aspirin and ticagrelor without interruption for 12 months, if no complications arise should be appropriate for hospital discharge tomorrow morning.   ECHOCARDIOGRAM COMPLETE  Result Date: 09/14/2022    ECHOCARDIOGRAM REPORT   Patient Name:   Stephen [name] Date of Exam: 09/14/2022 Medical Rec #:  [id number]      Height:       68.0 in Accession #:    [id number]     Weight:       193.5 lb Date of Birth:  Aug 12, 1955       BSA:          2.015 m Patient Age:    66 years       BP:           125/86  mmHg Patient Gender: M              HR:           76 bpm. Exam Location:  Inpatient Procedure: Strain Analysis, 2D Echo, Color Doppler and Cardiac Doppler Indications:    abnormal ecg  History:  Patient has no prior history of Echocardiogram examinations.                 Signs/Symptoms:Chest Pain; Risk Factors:Diabetes and                 Hypertension.  Sonographer:    Delcie Roch RDCS Referring Phys: [id number] [facility] S KHAN  Sonographer Comments: Global longitudinal strain was attempted. IMPRESSIONS  1. Left ventricular ejection fraction, by estimation, is 60 to 65%. The left ventricle has normal function. The left ventricle has no regional wall motion abnormalities. There is moderate concentric left ventricular hypertrophy. Left ventricular diastolic parameters are consistent with Grade I diastolic dysfunction (impaired relaxation).  2. Right ventricular systolic function is normal. The right ventricular size is normal. Tricuspid regurgitation signal is inadequate for assessing PA pressure.  3. The mitral valve is normal in structure. No evidence of mitral valve regurgitation.  4. The aortic valve is tricuspid. Aortic valve regurgitation is not visualized.  5. The inferior vena cava is normal in size with greater than 50% respiratory variability, suggesting right atrial pressure of 3 mmHg. Conclusion(s)/Recommendation(s): Normal biventricular function without evidence of hemodynamically significant valvular heart disease. FINDINGS  Left Ventricle: Left ventricular ejection fraction, by estimation, is 60 to 65%. The left ventricle has normal function. The left ventricle has no regional wall motion abnormalities. The left ventricular internal cavity size was normal in size. There is  moderate concentric left ventricular hypertrophy. Left ventricular diastolic parameters are consistent with Grade I diastolic dysfunction (impaired relaxation). Right Ventricle: The right ventricular size is normal. Right  ventricular systolic function is normal. Tricuspid regurgitation signal is inadequate for assessing PA pressure. Left Atrium: Left atrial size was normal in size. Right Atrium: Right atrial size was normal in size. Pericardium: There is no evidence of pericardial effusion. Mitral Valve: The mitral valve is normal in structure. No evidence of mitral valve regurgitation. Tricuspid Valve: Tricuspid valve regurgitation is not demonstrated. Aortic Valve: The aortic valve is tricuspid. Aortic valve regurgitation is not visualized. Pulmonic Valve: Pulmonic valve regurgitation is not visualized. Aorta: The aortic root and ascending aorta are structurally normal, with no evidence of dilitation. Venous: The inferior vena cava is normal in size with greater than 50% respiratory variability, suggesting right atrial pressure of 3 mmHg. IAS/Shunts: No atrial level shunt detected by color flow Doppler.  LEFT VENTRICLE PLAX 2D LVIDd:         4.40 cm   Diastology LVIDs:         3.40 cm   LV e' medial:    4.90 cm/s LV PW:         1.50 cm   LV E/e' medial:  14.2 LV IVS:        1.50 cm   LV e' lateral:   7.07 cm/s LVOT diam:     2.10 cm   LV E/e' lateral: 9.8 LV SV:         63 LV SV Index:   31        2D Longitudinal Strain LVOT Area:     3.46 cm  2D Strain GLS Avg:     -14.0 %  RIGHT VENTRICLE RV Basal diam:  2.60 cm RV S prime:     12.80 cm/s TAPSE (M-mode): 1.6 cm LEFT ATRIUM             Index        RIGHT ATRIUM           Index LA  diam:        3.80 cm 1.89 cm/m   RA Area:     10.50 cm LA Vol (A2C):   30.4 ml 15.09 ml/m  RA Volume:   19.90 ml  9.87 ml/m LA Vol (A4C):   28.3 ml 14.04 ml/m LA Biplane Vol: 30.9 ml 15.33 ml/m  AORTIC VALVE LVOT Vmax:   110.00 cm/s LVOT Vmean:  65.500 cm/s LVOT VTI:    0.181 m  AORTA Ao Root diam: 3.70 cm Ao Asc diam:  3.00 cm MITRAL VALVE MV Area (PHT): 3.91 cm    SHUNTS MV Decel Time: 194 msec    Systemic VTI:  0.18 m MV E velocity: 69.50 cm/s  Systemic Diam: 2.10 cm MV A velocity: 91.30 cm/s  MV E/A ratio:  0.76 [place] signed by Carolan Clines Signature Date/Time: 09/14/2022/10:35:11 AM    Final    DG Chest 2 View  Result Date: 09/13/2022 CLINICAL DATA:  Chest pain EXAM: CHEST - 2 VIEW COMPARISON:  Chest radiograph dated 01/15/2015 FINDINGS: Normal lung volumes. No focal consolidations. No pleural effusion or pneumothorax. The heart size and mediastinal contours are within normal limits. No acute osseous abnormality. IMPRESSION: No active cardiopulmonary disease. Electronically Signed   By: Agustin Cree M.D.   On: 09/13/2022 14:37   Disposition   Pt is being discharged home today in good condition.  Follow-up Plans & Appointments     Follow-up Information     Sharlene Dory, PA-C Follow up on 09/25/2022.   Specialty: Cardiology Why: ?Follow up with Helayne Seminole PA-C on 09/25/22 at 2:20pm. Contact information: [address] [place] [address] [phone]                Discharge Instructions     AMB Referral to Cardiac Rehabilitation - Phase II   Complete by: As directed    Diagnosis: NSTEMI   After initial evaluation and assessments completed: Virtual Based Care may be provided alone or in conjunction with Phase 2 Cardiac Rehab based on patient barriers.: Yes   Intensive Cardiac Rehabilitation (ICR) [facility] location only OR Traditional Cardiac Rehabilitation (TCR) *If criteria for ICR are not met will enroll in TCR [facility] only): Yes        Discharge Medications   Allergies as of 09/15/2022   No Known Allergies      Medication List     STOP taking these medications    ibuprofen 200 MG tablet Commonly known as: ADVIL   losartan-hydrochlorothiazide 100-12.5 MG tablet Commonly known as: HYZAAR       TAKE these medications    aspirin EC 81 MG tablet Take 1 tablet (81 mg total) by mouth daily. Swallow whole. Start taking on: Sep 16, 2022   atorvastatin 80 MG tablet Commonly known as: LIPITOR Take 1 tablet (80 mg total) by  mouth daily. What changed:  medication strength how much to take   empagliflozin 10 MG Tabs tablet Commonly known as: Jardiance Take 1 tablet (10 mg total) by mouth daily before breakfast.   esomeprazole 20 MG capsule Commonly known as: NEXIUM Take 20 mg by mouth daily at 12 noon.   Glucosamine Sulfate 1000 MG Caps Take 2 capsules by mouth daily.   metFORMIN 500 MG tablet Commonly known as: GLUCOPHAGE Take 1 tablet (500 mg total) by mouth daily. Start taking on: Sep 17, 2022 What changed: These instructions start on Sep 17, 2022. If you are unsure what to do until then, ask  your doctor or other care provider.   metoprolol succinate 25 MG 24 hr tablet Commonly known as: Toprol XL Take 1 tablet (25 mg total) by mouth daily.   nitroGLYCERIN 0.4 MG SL tablet Commonly known as: NITROSTAT Place 1 tablet (0.4 mg total) under the tongue every 5 (five) minutes x 3 doses as needed for chest pain.   ticagrelor 90 MG Tabs tablet Commonly known as: BRILINTA Take 1 tablet (90 mg total) by mouth 2 (two) times daily.           Outstanding Labs/Studies   Repeat BMP 2-3 days prior to follow up on 5/30.  Duration of Discharge Encounter   Greater than 30 minutes including physician time.  Con Memos, PA-C 09/15/2022, 12:54 PM   Agree with note by Perlie Gold, PA-C.  Patient admitted with non-STEMI.  Cardiac catheterization performed yesterday by Dr. Excell Seltzer revealed an occluded first OM which was stented and proximal to mid LAD as well.  His EF is preserved.  He was having no symptoms this morning.  Right radial puncture site was stable.  He was on DAPT which will need to be uninterrupted for at least 12 months.  Okay for discharge home with TOC 7 already scheduled.  Runell Gess, M.D., FACP, [facility], [facility] [address] [place], [place]  [address]  [phone] 09/15/2022 2:39 PM

## 2022-09-15 NOTE — Plan of Care (Signed)
Problem: Education: Goal: Knowledge of General Education information will improve Description: Including pain rating scale, medication(s)/side effects and non-pharmacologic comfort measures Outcome: Adequate for Discharge   Problem: Health Behavior/Discharge Planning: Goal: Ability to manage health-related needs will improve 09/15/2022 1302 by Garth Bigness, RN Outcome: Adequate for Discharge 09/15/2022 0759 by Garth Bigness, RN Outcome: Progressing   Problem: Clinical Measurements: Goal: Ability to maintain clinical measurements within normal limits will improve Outcome: Adequate for Discharge Goal: Will remain free from infection Outcome: Adequate for Discharge Goal: Diagnostic test results will improve Outcome: Adequate for Discharge Goal: Respiratory complications will improve 09/15/2022 1302 by Garth Bigness, RN Outcome: Adequate for Discharge 09/15/2022 0759 by Garth Bigness, RN Outcome: Progressing Goal: Cardiovascular complication will be avoided Outcome: Adequate for Discharge   Problem: Activity: Goal: Risk for activity intolerance will decrease 09/15/2022 1302 by Garth Bigness, RN Outcome: Adequate for Discharge 09/15/2022 0759 by Garth Bigness, RN Outcome: Progressing   Problem: Nutrition: Goal: Adequate nutrition will be maintained 09/15/2022 1302 by Garth Bigness, RN Outcome: Adequate for Discharge 09/15/2022 0759 by Garth Bigness, RN Outcome: Progressing   Problem: Coping: Goal: Level of anxiety will decrease 09/15/2022 1302 by Garth Bigness, RN Outcome: Adequate for Discharge 09/15/2022 0759 by Garth Bigness, RN Outcome: Progressing   Problem: Elimination: Goal: Will not experience complications related to bowel motility 09/15/2022 1302 by Garth Bigness, RN Outcome: Adequate for Discharge 09/15/2022 0759 by Garth Bigness, RN Outcome: Progressing Goal: Will not experience complications related to urinary  retention 09/15/2022 1302 by Garth Bigness, RN Outcome: Adequate for Discharge 09/15/2022 0759 by Garth Bigness, RN Outcome: Progressing   Problem: Pain Managment: Goal: General experience of comfort will improve Outcome: Adequate for Discharge   Problem: Safety: Goal: Ability to remain free from injury will improve Outcome: Adequate for Discharge   Problem: Skin Integrity: Goal: Risk for impaired skin integrity will decrease Outcome: Adequate for Discharge   Problem: Education: Goal: Understanding of cardiac disease, CV risk reduction, and recovery process will improve Outcome: Adequate for Discharge Goal: Individualized Educational Video(s) Outcome: Adequate for Discharge   Problem: Activity: Goal: Ability to tolerate increased activity will improve Outcome: Adequate for Discharge   Problem: Cardiac: Goal: Ability to achieve and maintain adequate cardiovascular perfusion will improve Outcome: Adequate for Discharge   Problem: Health Behavior/Discharge Planning: Goal: Ability to safely manage health-related needs after discharge will improve Outcome: Adequate for Discharge   Problem: Education: Goal: Ability to describe self-care measures that may prevent or decrease complications (Diabetes Survival Skills Education) will improve Outcome: Adequate for Discharge Goal: Individualized Educational Video(s) Outcome: Adequate for Discharge   Problem: Coping: Goal: Ability to adjust to condition or change in health will improve Outcome: Adequate for Discharge   Problem: Fluid Volume: Goal: Ability to maintain a balanced intake and output will improve Outcome: Adequate for Discharge   Problem: Health Behavior/Discharge Planning: Goal: Ability to identify and utilize available resources and services will improve Outcome: Adequate for Discharge Goal: Ability to manage health-related needs will improve Outcome: Adequate for Discharge   Problem: Metabolic: Goal:  Ability to maintain appropriate glucose levels will improve Outcome: Adequate for Discharge   Problem: Nutritional: Goal: Maintenance of adequate nutrition will improve Outcome: Adequate for Discharge Goal: Progress toward achieving an optimal weight will improve Outcome: Adequate for Discharge   Problem: Skin Integrity: Goal: Risk for impaired skin integrity will decrease Outcome: Adequate for Discharge   Problem: Tissue Perfusion: Goal:  Adequacy of tissue perfusion will improve Outcome: Adequate for Discharge   Problem: Education: Goal: Understanding of CV disease, CV risk reduction, and recovery process will improve Outcome: Adequate for Discharge Goal: Individualized Educational Video(s) Outcome: Adequate for Discharge   Problem: Activity: Goal: Ability to return to baseline activity level will improve Outcome: Adequate for Discharge   Problem: Cardiovascular: Goal: Ability to achieve and maintain adequate cardiovascular perfusion will improve Outcome: Adequate for Discharge Goal: Vascular access site(s) Level 0-1 will be maintained Outcome: Adequate for Discharge   Problem: Health Behavior/Discharge Planning: Goal: Ability to safely manage health-related needs after discharge will improve Outcome: Adequate for Discharge   Problem: Education: Goal: Understanding of CV disease, CV risk reduction, and recovery process will improve Outcome: Adequate for Discharge Goal: Individualized Educational Video(s) Outcome: Adequate for Discharge   Problem: Activity: Goal: Ability to return to baseline activity level will improve Outcome: Adequate for Discharge   Problem: Cardiovascular: Goal: Ability to achieve and maintain adequate cardiovascular perfusion will improve Outcome: Adequate for Discharge Goal: Vascular access site(s) Level 0-1 will be maintained Outcome: Adequate for Discharge   Problem: Health Behavior/Discharge Planning: Goal: Ability to safely manage  health-related needs after discharge will improve Outcome: Adequate for Discharge

## 2022-09-17 ENCOUNTER — Telehealth: Payer: Self-pay | Admitting: Cardiology

## 2022-09-17 ENCOUNTER — Encounter: Payer: Self-pay | Admitting: Cardiovascular Disease

## 2022-09-17 LAB — LIPOPROTEIN A (LPA): Lipoprotein (a): 36 nmol/L — ABNORMAL HIGH (ref <75.0)

## 2022-09-17 NOTE — Telephone Encounter (Signed)
Error

## 2022-09-17 NOTE — Telephone Encounter (Signed)
Pt cousin Renita called in needing to know the process for short term disability paperwork for pt. Per Dr. Hazle Coca notes in the hospital, pt is to f/u with Dr. Wyline Mood after his [facility] appt with Julian Hy, [place].

## 2022-09-18 NOTE — Telephone Encounter (Signed)
Patient is returning LPN's call. He is requesting his cousin Renita Deanna Artis be called back so she adds him to the call. He was attempting to three way her in when previously on the phone.  Number left for callback it Renita's. Please advise.

## 2022-09-18 NOTE — Telephone Encounter (Signed)
Called the patient while on the phone, he put the call on hold for a while and never finished the call.

## 2022-09-18 NOTE — Telephone Encounter (Signed)
Spoke with patient and his cousin Renita. Confirmed appointment with Tessa for the 5/30 at 2:20.  He would like to know if Dr. Copper received FMLA and disability paperwork to fill out. He is aware if Huntley Dec his on vaction and will be back Tuesday.  Huntley Dec can you see if you guys have the paperwork. He sent it to Dr. Excell Seltzer because he was the only provider he met. I believe he will be a [facility] patient of Dr. Wyline Mood

## 2022-09-19 DIAGNOSIS — Z0279 Encounter for issue of other medical certificate: Secondary | ICD-10-CM

## 2022-09-19 NOTE — Telephone Encounter (Signed)
We received this patient's Short Term Disability paperwork.  Patient will be coming in to see Asa Lente on Thursday at which time he will sign the release of information and pay the $29 fee.  Since Asa Lente is the only provider he will/has seen in the office, she may be able to complete the disability form.  We'll know more on Thursday.

## 2022-09-20 ENCOUNTER — Emergency Department (HOSPITAL_BASED_OUTPATIENT_CLINIC_OR_DEPARTMENT_OTHER): Payer: 59

## 2022-09-20 ENCOUNTER — Ambulatory Visit (HOSPITAL_COMMUNITY)
Admission: EM | Admit: 2022-09-20 | Discharge: 2022-09-20 | Disposition: A | Discharge status: Home / Self Care | Payer: 59

## 2022-09-20 ENCOUNTER — Emergency Department (HOSPITAL_COMMUNITY)
Admission: EM | Admit: 2022-09-20 | Discharge: 2022-09-20 | Disposition: A | Discharge status: Home / Self Care | Payer: 59 | Attending: Emergency Medicine | Admitting: Emergency Medicine

## 2022-09-20 ENCOUNTER — Other Ambulatory Visit: Payer: Self-pay

## 2022-09-20 ENCOUNTER — Encounter (HOSPITAL_COMMUNITY): Payer: Self-pay | Admitting: *Deleted

## 2022-09-20 DIAGNOSIS — Z9861 Coronary angioplasty status: Secondary | ICD-10-CM | POA: Diagnosis not present

## 2022-09-20 DIAGNOSIS — E119 Type 2 diabetes mellitus without complications: Secondary | ICD-10-CM | POA: Diagnosis not present

## 2022-09-20 DIAGNOSIS — X58XXXA Exposure to other specified factors, initial encounter: Secondary | ICD-10-CM | POA: Diagnosis not present

## 2022-09-20 DIAGNOSIS — Z7982 Long term (current) use of aspirin: Secondary | ICD-10-CM | POA: Insufficient documentation

## 2022-09-20 DIAGNOSIS — Z9889 Other specified postprocedural states: Secondary | ICD-10-CM

## 2022-09-20 DIAGNOSIS — S60211A Contusion of right wrist, initial encounter: Secondary | ICD-10-CM | POA: Insufficient documentation

## 2022-09-20 DIAGNOSIS — I10 Essential (primary) hypertension: Secondary | ICD-10-CM | POA: Diagnosis not present

## 2022-09-20 DIAGNOSIS — Z79899 Other long term (current) drug therapy: Secondary | ICD-10-CM | POA: Diagnosis not present

## 2022-09-20 DIAGNOSIS — M79601 Pain in right arm: Secondary | ICD-10-CM | POA: Diagnosis not present

## 2022-09-20 DIAGNOSIS — Z7984 Long term (current) use of oral hypoglycemic drugs: Secondary | ICD-10-CM | POA: Diagnosis not present

## 2022-09-20 DIAGNOSIS — S6991XA Unspecified injury of right wrist, hand and finger(s), initial encounter: Secondary | ICD-10-CM | POA: Diagnosis present

## 2022-09-20 DIAGNOSIS — M25431 Effusion, right wrist: Secondary | ICD-10-CM | POA: Insufficient documentation

## 2022-09-20 LAB — CBC WITH DIFFERENTIAL/PLATELET
Abs Immature Granulocytes: 0.01 10*3/uL (ref 0.00–0.07)
Basophils Absolute: 0 10*3/uL (ref 0.0–0.1)
Basophils Relative: 1 %
Eosinophils Absolute: 0.1 10*3/uL (ref 0.0–0.5)
Eosinophils Relative: 1 %
HCT: 41.8 % (ref 39.0–52.0)
Hemoglobin: 14.1 g/dL (ref 13.0–17.0)
Immature Granulocytes: 0 %
Lymphocytes Relative: 19 %
Lymphs Abs: 0.8 10*3/uL (ref 0.7–4.0)
MCH: 28.3 pg (ref 26.0–34.0)
MCHC: 33.7 g/dL (ref 30.0–36.0)
MCV: 83.8 fL (ref 80.0–100.0)
Monocytes Absolute: 0.5 10*3/uL (ref 0.1–1.0)
Monocytes Relative: 13 %
Neutro Abs: 2.8 10*3/uL (ref 1.7–7.7)
Neutrophils Relative %: 66 %
Platelets: 219 10*3/uL (ref 150–400)
RBC: 4.99 MIL/uL (ref 4.22–5.81)
RDW: 13.2 % (ref 11.5–15.5)
WBC: 4.2 10*3/uL (ref 4.0–10.5)
nRBC: 0 % (ref 0.0–0.2)

## 2022-09-20 LAB — BASIC METABOLIC PANEL
Anion gap: 10 (ref 5–15)
BUN: 19 mg/dL (ref 8–23)
CO2: 23 mmol/L (ref 22–32)
Calcium: 9 mg/dL (ref 8.9–10.3)
Chloride: 104 mmol/L (ref 98–111)
Creatinine, Ser: 1.2 mg/dL (ref 0.61–1.24)
GFR, Estimated: >60 mL/min (ref >60)
Glucose, Bld: 121 mg/dL — ABNORMAL HIGH (ref 70–99)
Potassium: 3.8 mmol/L (ref 3.5–5.1)
Sodium: 137 mmol/L (ref 135–145)

## 2022-09-20 NOTE — ED Notes (Signed)
Transported to vascular lab

## 2022-09-20 NOTE — Progress Notes (Signed)
VASCULAR LAB    Right upper extremity arterial duplex has been performed.  See CV proc for preliminary results.  Messaged negative results to Dr. Manus Gunning via secure chat  Rosezetta Schlatter, [facility], RVT 09/20/2022, 1:15 PM

## 2022-09-20 NOTE — ED Triage Notes (Signed)
States he had a heart cath on Sunday had 2 stents , they used his right wrist , states pressure dressing came off Wed. States last pm noticed numbness and tingling in his right thumb and 4th finger. No numbness and tingling today. Inner aspect of right wrist is bruised . No swelling noted. Normal temp. Marland Kitchen

## 2022-09-20 NOTE — ED Provider Notes (Signed)
[name] Provider Note   CSN: [id number] Arrival date & time: 09/20/22  1056     History  Chief Complaint  Patient presents with   Arm Injury    [name] is a 67 y.o. male.  Patient with history of hypertension, diabetes, recent NSTEMI with heart catheterization on May 25 2 stents presenting with discoloration and swelling to his right wrist at site of cath.  States bandage came off 3 days ago but last night he noticed there is some swelling to his ulnar wrist and some bruising and discoloration.  Denies pain.  Did have some transient numbness to his thumb and small finger left side which has resolved.  No weakness, numbness or tingling.  No chest pain or shortness of breath.  No fever.  He is on aspirin and Brilinta.  Denies any other issues.  States he is doing well from a cardiac standpoint.  He became concerned about the swelling discoloration to his right wrist as well as the numbness.  No weakness in the hand.  No difficulty speaking or difficulty swallowing.  The history is provided by the patient.  Arm Injury Associated symptoms: no back pain and no fever        Home Medications Prior to Admission medications   Medication Sig Start Date End Date Taking? Authorizing Provider  aspirin EC 81 MG tablet Take 1 tablet (81 mg total) by mouth daily. Swallow whole. 09/16/22   Stephen Dellen, MD  atorvastatin (LIPITOR) 80 MG tablet Take 1 tablet (80 mg total) by mouth daily. 09/15/22   Stephen Dellen, MD  empagliflozin (JARDIANCE) 10 MG TABS tablet Take 1 tablet (10 mg total) by mouth daily before breakfast. 09/15/22   Stephen Dellen, MD  esomeprazole (NEXIUM) 20 MG capsule Take 20 mg by mouth daily at 12 noon.    [provider]  Glucosamine Sulfate 1000 MG CAPS Take 2 capsules by mouth daily.    [provider]  metFORMIN (GLUCOPHAGE) 500 MG tablet Take 1 tablet (500 mg total) by mouth daily. 09/17/22   Stephen Dellen, MD  metoprolol succinate (TOPROL XL) 25 MG 24 hr tablet Take 1 tablet (25 mg total) by mouth daily. 09/15/22 09/15/23  Stephen Dellen, MD  nitroGLYCERIN (NITROSTAT) 0.4 MG SL tablet Place 1 tablet (0.4 mg total) under the tongue every 5 (five) minutes x 3 doses as needed for chest pain. 09/15/22   Stephen Dellen, MD  ticagrelor (BRILINTA) 90 MG TABS tablet Take 1 tablet (90 mg total) by mouth 2 (two) times daily. 09/15/22   Stephen Dellen, MD      Allergies    Patient has no known allergies.    Review of Systems   Review of Systems  Constitutional:  Negative for activity change, appetite change and fever.  HENT:  Negative for congestion and rhinorrhea.   Respiratory:  Negative for cough, chest tightness and shortness of breath.   Cardiovascular:  Negative for chest pain.  Gastrointestinal:  Negative for abdominal pain, nausea and vomiting.  Genitourinary:  Negative for dysuria and hematuria.  Musculoskeletal:  Negative for back pain.  Skin:  Positive for wound. Negative for pallor.  Neurological:  Negative for weakness, numbness and headaches.   all other systems are negative except as noted in the HPI and PMH.    Physical Exam Updated Vital Signs BP (!) 138/95 (BP Location: Left Arm)   Pulse 92  Temp 98.4 F (36.9 C) (Oral)   Resp 16   Ht 5\' 8"  (1.727 m)   Wt 88.5 kg   SpO2 99%   BMI 29.65 kg/m  Physical Exam Vitals and nursing note reviewed.  Constitutional:      General: He is not in acute distress.    Appearance: He is well-developed.  HENT:     Head: Normocephalic and atraumatic.     Mouth/Throat:     Pharynx: No oropharyngeal exudate.  Eyes:     Conjunctiva/sclera: Conjunctivae normal.     Pupils: Pupils are equal, round, and reactive to light.  Neck:     Comments: No meningismus. Cardiovascular:     Rate and Rhythm: Normal rate and regular rhythm.     Heart sounds: Normal heart sounds. No murmur heard. Pulmonary:     Effort: Pulmonary effort  is normal. No respiratory distress.     Breath sounds: Normal breath sounds.  Abdominal:     Palpations: Abdomen is soft.     Tenderness: There is no abdominal tenderness. There is no guarding or rebound.  Musculoskeletal:        General: No tenderness. Normal range of motion.     Cervical back: Normal range of motion and neck supple.     Comments: Slight swelling and ecchymosis to right palmar wrist.  Cath site appears benign without surrounding hematoma.  Intact radial pulse and ulnar pulse.  Cardinal hand movements are intact.  Skin:    General: Skin is warm.  Neurological:     Mental Status: He is alert and oriented to person, place, and time.     Cranial Nerves: No cranial nerve deficit.     Motor: No abnormal muscle tone.     Coordination: Coordination normal.     Comments:  5/5 strength throughout. CN 2-12 intact.Equal grip strength.   Psychiatric:        Behavior: Behavior normal.     ED Results / Procedures / Treatments   Labs (all labs ordered are listed, but only abnormal results are displayed) Labs Reviewed  BASIC METABOLIC PANEL - Abnormal; Notable for the following components:      Result Value   Glucose, Bld 121 (*)    All other components within normal limits  CBC WITH DIFFERENTIAL/PLATELET    EKG None  Radiology VAS [place] UPPER EXTREMITY ARTERIAL DUPLEX  Result Date: 09/20/2022 UPPER EXTREMITY DUPLEX STUDY Patient Name:  [name]  Date of Exam:   09/20/2022 Medical Rec #: [id number]       Accession #:    [id number] Date of Birth: 05/02/1955        Patient Gender: M Patient Age:   81 years Exam Location:  [name] Procedure:      VAS [place] UPPER EXTREMITY ARTERIAL DUPLEX Referring Phys: [name] --------------------------------------------------------------------------------  Indications: Redness and swelling. History:     Patient has a history of catheterization via right radial artery.  Risk Factors: Hypertension, hyperlipidemia, Diabetes,  coronary artery disease. Comparison Study: No prior study on file Performing Technologist: [name] RVS  Examination Guidelines: A complete evaluation includes B-mode imaging, spectral Doppler, color Doppler, and power Doppler as needed of all accessible portions of each vessel. Bilateral testing is considered an integral part of a complete examination. Limited examinations for reoccurring indications may be performed as noted.  Right Doppler Findings: +---------------+----------+---------+--------+--------+ Site           PSV (cm/s)Waveform StenosisComments +---------------+----------+---------+--------+--------+ Subclavian Dist  triphasic                 +---------------+----------+---------+--------+--------+ Radial Prox    0.48      triphasic                 +---------------+----------+---------+--------+--------+ Radial Mid     0.80      triphasic                 +---------------+----------+---------+--------+--------+ Radial Dist    0.55      triphasic                 +---------------+----------+---------+--------+--------+ Ulnar Prox     0.70      triphasic                 +---------------+----------+---------+--------+--------+ Ulnar Mid      0.80      triphasic                 +---------------+----------+---------+--------+--------+ Ulnar Dist     0.53      triphasic                 +---------------+----------+---------+--------+--------+ Palmar Arch    0.74      triphasic                 +---------------+----------+---------+--------+--------+    Summary:  Right: No pseudoaneurysm or AV fistula noted in the right arm at        site of cath. Radial and Ulnar arteries with triphasic. No        DVT noted in the right radial or ulnar veins. Intersitial        edema noted at the wrist. *See table(s) above for measurements and observations.    Preliminary     Procedures Procedures    Medications Ordered in ED Medications - No data to  display  ED Course/ Medical Decision Making/ A&P                             Medical Decision Making Amount and/or Complexity of Data Reviewed Labs: ordered. Decision-making details documented in ED Course. Radiology: ordered and independent interpretation performed. Decision-making details documented in ED Course. ECG/medicine tests: ordered and independent interpretation performed. Decision-making details documented in ED Course.   Swelling and discoloration to right wrist cath site.  Transient numbness of fingers last night now resolved.  Vital stable, no distress.  Intact radial pulse and cardinal hand movements.  LHC 09/15/22 1.  Severe two-vessel coronary artery disease with total occlusion of the first OM branch of the circumflex and severe stenosis of the proximal to mid LAD, both lesions treated with PCI using a single drug-eluting stent at each lesion site 2.  Patent RCA with mild nonobstructive disease 3.  Normal LVEF by echo assessment with no regional wall motion abnormalities, normal LVEDP at cardiac catheterization   Cath site appears benign.  Discussed with cardiology Dr. Izora Ribas.  He recommends arterial Doppler of arm.  Right upper EXTR duplex is negative for pseudoaneurysm, AV Fistula, has normal radial, ulnar, and palmar arterial flow, and no DVT in the radial or ulnar veins   Labs are stable.  No anemia or electrolyte abnormalities.  Follow-up with cardiology as scheduled.  Use ice, elevation for discomfort to right wrist catheterization site.  Continue aspirin and Brilinta.   Return to the ED with worsening symptoms including weakness, numbness, tingling, chest pain, shortness of breath or any other  concerns.        Final Clinical Impression(s) / ED Diagnoses Final diagnoses:  Status post cardiac catheterization    Rx / DC Orders ED Discharge Orders     None         Cordia Miklos, Jeannett Senior, MD 09/20/22 1328

## 2022-09-20 NOTE — Discharge Instructions (Signed)
Ultrasound today shows normal artery at the site of your catheterization.  Continue medications as prescribed and follow-up with your cardiologist as scheduled.  Return to the ED with worsening pain, numbness, tingling, chest pain, shortness of breath or other concerns.

## 2022-09-24 ENCOUNTER — Other Ambulatory Visit (HOSPITAL_COMMUNITY)
Admission: RE | Admit: 2022-09-24 | Discharge: 2022-09-24 | Disposition: A | Discharge status: Home / Self Care | Payer: 59 | Source: Ambulatory Visit | Attending: Cardiovascular Disease | Admitting: Cardiovascular Disease

## 2022-09-24 ENCOUNTER — Ambulatory Visit: Payer: 59 | Admitting: General Practice

## 2022-09-24 DIAGNOSIS — R7989 Other specified abnormal findings of blood chemistry: Secondary | ICD-10-CM | POA: Diagnosis present

## 2022-09-24 LAB — BASIC METABOLIC PANEL
Anion gap: 11 (ref 5–15)
BUN: 18 mg/dL (ref 8–23)
CO2: 22 mmol/L (ref 22–32)
Calcium: 9.4 mg/dL (ref 8.9–10.3)
Chloride: 102 mmol/L (ref 98–111)
Creatinine, Ser: 1.11 mg/dL (ref 0.61–1.24)
GFR, Estimated: >60 mL/min (ref >60)
Glucose, Bld: 111 mg/dL — ABNORMAL HIGH (ref 70–99)
Potassium: 3.9 mmol/L (ref 3.5–5.1)
Sodium: 135 mmol/L (ref 135–145)

## 2022-09-24 NOTE — Telephone Encounter (Signed)
Patient's cousin followed up on his behalf. She wants to confirm that the disability form will be completed during his appointment with Jari Favre tomorrow. She states she has been trying to help but the call can be returned to the patient.

## 2022-09-25 ENCOUNTER — Ambulatory Visit: Payer: 59 | Attending: Physician Assistant | Admitting: Physician Assistant

## 2022-09-25 ENCOUNTER — Encounter: Payer: Self-pay | Admitting: Physician Assistant

## 2022-09-25 VITALS — BP 128/78 | HR 92 | Ht 68.0 in | Wt 192.8 lb

## 2022-09-25 DIAGNOSIS — E785 Hyperlipidemia, unspecified: Secondary | ICD-10-CM

## 2022-09-25 DIAGNOSIS — I2510 Atherosclerotic heart disease of native coronary artery without angina pectoris: Secondary | ICD-10-CM | POA: Diagnosis not present

## 2022-09-25 DIAGNOSIS — I10 Essential (primary) hypertension: Secondary | ICD-10-CM | POA: Diagnosis not present

## 2022-09-25 NOTE — Progress Notes (Signed)
Office Visit    Patient Name: Stephen [name] Date of Encounter: 09/25/2022  PCP:  Georgann Housekeeper, MD   [facility]  Cardiologist:  Reatha Harps, MD  Advanced Practice Provider:  No care team member to display Electrophysiologist:  None   HPI    Stephen [name] is a 67 y.o. male with a past medical history of hypertension, diabetes, recent NSTEMI with cardiac medication admitted 20 February 2 stents placed presents today for hospital follow-up.  Most recently seen in the ED 09/20/2022 for discoloration and swelling of the right wrist at the cath site.  Denied pain.  Did have some transient numbness to his thumb and small finger on the left side which resolved.  No weakness, numbness, or tingling.  No chest pain or shortness of breath.  No fever.  On aspirin and Brilinta as prescribed.  Other issues.  Cath site was evaluated and patient was ultimately discharged.  Arterial Doppler was negative for pseudoaneurysm, AV fistula, normal radial, ulnar, and palmar artery flow.  Today, he tells me that he has not had any chest pain but does get hot flashes melanite.  He sits up at the end of the bed and feels like he cannot breathe.  Once the hot flash resolves he can breathe much better.  He states his sleep cycle is off.  He goes to bed around 7 PM and is up intermittently throughout the night.  Gets up around 4 to 5 AM.  He is a truck driver by trade and we discussed filling out FMLA paperwork however, he has his 67th birthday on Sunday and may consider retiring.  We discussed resuming his normal activities but using commonsense and taking lots of breaks.  We discussed enrolling in cardiac rehab  Reports no chest pain, pressure, or tightness. No edema, orthopnea, PND. Reports no palpitations.   Past Medical History    Past Medical History:  Diagnosis Date   Cancer [facility]) 2008   prostate   History of inguinal hernia repair    right   Hyperlipidemia    Hypertension     Past Surgical History:  Procedure Laterality Date   CORONARY/GRAFT ACUTE MI REVASCULARIZATION N/A 09/14/2022   Procedure: Coronary/Graft Acute MI Revascularization;  Surgeon: Tonny Bollman, MD;  Location: [facility] INVASIVE CV LAB;  Service: Cardiovascular;  Laterality: N/A;   HEMORRHOID SURGERY     HERNIA REPAIR     INGUINAL HERNIA REPAIR  03/28/2011   Procedure: HERNIA REPAIR INGUINAL ADULT;  Surgeon: Robyne Askew, MD;  Location: [facility];  Service: General;  Laterality: Right;   LEFT HEART CATH AND CORONARY ANGIOGRAPHY N/A 09/14/2022   Procedure: LEFT HEART CATH AND CORONARY ANGIOGRAPHY;  Surgeon: Tonny Bollman, MD;  Location: [facility] INVASIVE CV LAB;  Service: Cardiovascular;  Laterality: N/A;   PROSTATE SURGERY  02/17/07    Allergies  No Known Allergies   EKGs/Labs/Other Studies Reviewed:   The following studies were reviewed today: Cardiac Studies & Procedures   CARDIAC CATHETERIZATION  CARDIAC CATHETERIZATION 09/14/2022  Narrative   Prox RCA lesion is 40% stenosed.   1st Mrg lesion is 100% stenosed.   Prox LAD to Mid LAD lesion is 90% stenosed.   A drug-eluting stent was successfully placed using a STENT ONYX FRONTIER 2.5X26.   A drug-eluting stent was successfully placed using a STENT ONYX FRONTIER 3.5X18.   Post intervention, there is a 0% residual stenosis.   Post intervention, there is a 0%  residual stenosis.  1.  Severe two-vessel coronary artery disease with total occlusion of the first OM branch of the circumflex and severe stenosis of the proximal to mid LAD, both lesions treated with PCI using a single drug-eluting stent at each lesion site 2.  Patent RCA with mild nonobstructive disease 3.  Normal LVEF by echo assessment with no regional wall motion abnormalities, normal LVEDP at cardiac catheterization  Recommendations: Aggressive medical therapy, DAPT with aspirin and ticagrelor without interruption for 12 months, if no complications arise should  be appropriate for hospital discharge tomorrow morning.  Findings Coronary Findings Diagnostic  Dominance: Right  Left Anterior Descending Prox LAD to Mid LAD lesion is 90% stenosed. The lesion is eccentric. The lesion is mildly calcified.  Left Circumflex  First Obtuse Marginal Branch 1st Mrg lesion is 100% stenosed. The lesion is thrombotic.  Right Coronary Artery There is mild diffuse disease throughout the vessel. Dominant vessel, PDA and PLA branches are both patent.  There is mild nonobstructive plaquing throughout the mid and distal RCA without any significant high-grade stenoses. Prox RCA lesion is 40% stenosed.  Intervention  Prox LAD to Mid LAD lesion Stent A drug-eluting stent was successfully placed using a STENT ONYX FRONTIER 3.5X18. Post-Intervention Lesion Assessment The intervention was successful. Pre-interventional TIMI flow is 3. Post-intervention TIMI flow is 3. No complications occurred at this lesion. There is a 0% residual stenosis post intervention.  1st Mrg lesion Stent A drug-eluting stent was successfully placed using a STENT ONYX FRONTIER 2.5X26. Post-Intervention Lesion Assessment The intervention was successful. Pre-interventional TIMI flow is 0. Post-intervention TIMI flow is 3. No complications occurred at this lesion. There is a 0% residual stenosis post intervention.     ECHOCARDIOGRAM  ECHOCARDIOGRAM COMPLETE 09/14/2022  Narrative ECHOCARDIOGRAM REPORT    Patient Name:   Stephen [name] Date of Exam: 09/14/2022 Medical Rec #:  [id number]      Height:       68.0 in Accession #:    [id number]     Weight:       193.5 lb Date of Birth:  03-04-1956       BSA:          2.015 m Patient Age:    66 years       BP:           125/86 mmHg Patient Gender: M              HR:           76 bpm. Exam Location:  Inpatient  Procedure: Strain Analysis, 2D Echo, Color Doppler and Cardiac Doppler  Indications:    abnormal ecg  History:        Patient  has no prior history of Echocardiogram examinations. Signs/Symptoms:Chest Pain; Risk Factors:Diabetes and Hypertension.  Sonographer:    Delcie Roch RDCS Referring Phys: [id number] [facility]   Sonographer Comments: Global longitudinal strain was attempted. IMPRESSIONS   1. Left ventricular ejection fraction, by estimation, is 60 to 65%. The left ventricle has normal function. The left ventricle has no regional wall motion abnormalities. There is moderate concentric left ventricular hypertrophy. Left ventricular diastolic parameters are consistent with Grade I diastolic dysfunction (impaired relaxation). 2. Right ventricular systolic function is normal. The right ventricular size is normal. Tricuspid regurgitation signal is inadequate for assessing PA pressure. 3. The mitral valve is normal in structure. No evidence of mitral valve regurgitation. 4. The aortic valve is tricuspid. Aortic valve regurgitation is not visualized.  5. The inferior vena cava is normal in size with greater than 50% respiratory variability, suggesting right atrial pressure of 3 mmHg.  Conclusion(s)/Recommendation(s): Normal biventricular function without evidence of hemodynamically significant valvular heart disease.  FINDINGS Left Ventricle: Left ventricular ejection fraction, by estimation, is 60 to 65%. The left ventricle has normal function. The left ventricle has no regional wall motion abnormalities. The left ventricular internal cavity size was normal in size. There is moderate concentric left ventricular hypertrophy. Left ventricular diastolic parameters are consistent with Grade I diastolic dysfunction (impaired relaxation).  Right Ventricle: The right ventricular size is normal. Right ventricular systolic function is normal. Tricuspid regurgitation signal is inadequate for assessing PA pressure.  Left Atrium: Left atrial size was normal in size.  Right Atrium: Right atrial size was normal in  size.  Pericardium: There is no evidence of pericardial effusion.  Mitral Valve: The mitral valve is normal in structure. No evidence of mitral valve regurgitation.  Tricuspid Valve: Tricuspid valve regurgitation is not demonstrated.  Aortic Valve: The aortic valve is tricuspid. Aortic valve regurgitation is not visualized.  Pulmonic Valve: Pulmonic valve regurgitation is not visualized.  Aorta: The aortic root and ascending aorta are structurally normal, with no evidence of dilitation.  Venous: The inferior vena cava is normal in size with greater than 50% respiratory variability, suggesting right atrial pressure of 3 mmHg.  IAS/Shunts: No atrial level shunt detected by color flow Doppler.   LEFT VENTRICLE PLAX 2D LVIDd:         4.40 cm   Diastology LVIDs:         3.40 cm   LV e' medial:    4.90 cm/s LV PW:         1.50 cm   LV E/e' medial:  14.2 LV IVS:        1.50 cm   LV e' lateral:   7.07 cm/s LVOT diam:     2.10 cm   LV E/e' lateral: 9.8 LV SV:         63 LV SV Index:   31        2D Longitudinal Strain LVOT Area:     3.46 cm  2D Strain GLS Avg:     -14.0 %   RIGHT VENTRICLE RV Basal diam:  2.60 cm RV S prime:     12.80 cm/s TAPSE (M-mode): 1.6 cm  LEFT ATRIUM             Index        RIGHT ATRIUM           Index LA diam:        3.80 cm 1.89 cm/m   RA Area:     10.50 cm LA Vol (A2C):   30.4 ml 15.09 ml/m  RA Volume:   19.90 ml  9.87 ml/m LA Vol (A4C):   28.3 ml 14.04 ml/m LA Biplane Vol: 30.9 ml 15.33 ml/m AORTIC VALVE LVOT Vmax:   110.00 cm/s LVOT Vmean:  65.500 cm/s LVOT VTI:    0.181 m  AORTA Ao Root diam: 3.70 cm Ao Asc diam:  3.00 cm  MITRAL VALVE MV Area (PHT): 3.91 cm    SHUNTS MV Decel Time: 194 msec    Systemic VTI:  0.18 m MV E velocity: 69.50 cm/s  Systemic Diam: 2.10 cm MV A velocity: 91.30 cm/s MV E/A ratio:  0.76  Photographer signed by Carolan Clines Signature Date/Time: 09/14/2022/10:35:11 AM    Final  EKG:  EKG is not ordered today.    Recent Labs: 09/14/2022: B Natriuretic Peptide 69.4 09/15/2022: TSH 0.581 09/20/2022: Hemoglobin 14.1; Platelets 219 09/24/2022: BUN 18; Creatinine, Ser 1.11; Potassium 3.9; Sodium 135  Recent Lipid Panel    Component Value Date/Time   CHOL 144 09/14/2022 0650   TRIG 37 09/14/2022 0650   HDL 61 09/14/2022 0650   CHOLHDL 2.4 09/14/2022 0650   VLDL 7 09/14/2022 0650   LDLCALC 76 09/14/2022 0650    Home Medications   Current Meds  Medication Sig   aspirin EC 81 MG tablet Take 1 tablet (81 mg total) by mouth daily. Swallow whole.   atorvastatin (LIPITOR) 80 MG tablet Take 1 tablet (80 mg total) by mouth daily.   empagliflozin (JARDIANCE) 10 MG TABS tablet Take 1 tablet (10 mg total) by mouth daily before breakfast.   esomeprazole (NEXIUM) 20 MG capsule Take 20 mg by mouth daily at 12 noon.   Glucosamine Sulfate 1000 MG CAPS Take 2 capsules by mouth daily.   metFORMIN (GLUCOPHAGE) 500 MG tablet Take 1 tablet (500 mg total) by mouth daily.   metoprolol succinate (TOPROL XL) 25 MG 24 hr tablet Take 1 tablet (25 mg total) by mouth daily.   nitroGLYCERIN (NITROSTAT) 0.4 MG SL tablet Place 1 tablet (0.4 mg total) under the tongue every 5 (five) minutes x 3 doses as needed for chest pain.   ticagrelor (BRILINTA) 90 MG TABS tablet Take 1 tablet (90 mg total) by mouth 2 (two) times daily.     Review of Systems      All other systems reviewed and are otherwise negative except as noted above.  Physical Exam    VS:  BP 128/78   Pulse 92   Ht 5\' 8"  (1.727 m)   Wt 192 lb 12.8 oz (87.5 kg)   SpO2 98%   BMI 29.32 kg/m  , BMI Body mass index is 29.32 kg/m.  Wt Readings from Last 3 Encounters:  09/25/22 192 lb 12.8 oz (87.5 kg)  09/20/22 195 lb (88.5 kg)  09/13/22 193 lb 8 oz (87.8 kg)    GEN: Well nourished, well developed, in no acute distress. HEENT: normal. Neck: Supple, no JVD, carotid bruits, or masses. Cardiac: RRR, no murmurs, rubs, or  gallops. No clubbing, cyanosis, edema.  Radials/PT 2+ and equal bilaterally.  Respiratory:  Respirations regular and unlabored, clear to auscultation bilaterally. GI: Soft, nontender, nondistended. MS: No deformity or atrophy. Skin: Warm and dry, no rash. Neuro:  Strength and sensation are intact. Psych: Normal affect.  Assessment & Plan    CAD status post PCI to proximal to mid LAD and first OM branch -Continue current medications which include aspirin 81 mg daily, Lipitor 80 mg daily, Jardiance 10 mg daily, metoprolol succinate 25 mg daily, nitro as needed, Brilinta 90 mg twice a day -DAPT for at least 6 months -Brought in [facility] paperwork today for me to complete -Referral to rehab at [facility]  Hypertension -Blood pressure well-controlled today 128/78 -Continue current medication regimen -Continue heart healthy, low-sodium diet  HLD -Continue Lipitor 80 mg daily -Will need an updated lipid panel at his next visit -New LDL goal will be less than 55  DM type II -Most recent A1c 6.4 -Continue control per primary         Cardiac Rehabilitation Eligibility Assessment  The patient is ready to start cardiac rehabilitation from a cardiac standpoint.   Disposition: Follow up 3-4 months  with Reatha Harps, MD  or APP.  Signed, Sharlene Dory, PA-C 09/25/2022, 4:29 PM [facility]

## 2022-09-25 NOTE — Patient Instructions (Signed)
Medication Instructions:   Your physician recommends that you continue on your current medications as directed. Please refer to the Current Medication list given to you today.  *If you need a refill on your cardiac medications before your next appointment, please call your pharmacy*   Lab Work: NONE ORDERED  TODAY    If you have labs (blood work) drawn today and your tests are completely normal, you will receive your results only by: MyChart Message (if you have MyChart) OR A paper copy in the mail If you have any lab test that is abnormal or we need to change your treatment, we will call you to review the results.   Testing/Procedures: NONE ORDERED  TODAY      Follow-Up: At [facility], you and your health needs are our priority.  As part of our continuing mission to provide you with exceptional heart care, we have created designated Provider Care Teams.  These Care Teams include your primary Cardiologist (physician) and Advanced Practice Providers (APPs -  Physician Assistants and Nurse Practitioners) who all work together to provide you with the care you need, when you need it.  We recommend signing up for the patient portal called "MyChart".  Sign up information is provided on this After Visit Summary.  MyChart is used to connect with patients for Virtual Visits (Telemedicine).  Patients are able to view lab/test results, encounter notes, upcoming appointments, etc.  Non-urgent messages can be sent to your provider as well.   To learn more about what you can do with MyChart, go to [email or web address].    Your next appointment:  You have been referred to CARDIAC REHAB   3 -4 month(s)  Provider:    Dr. Flora Lipps       Other Instructions

## 2022-09-26 NOTE — Telephone Encounter (Signed)
Completed form scanned to [employer] and to patient's chart. Billing notified.

## 2022-10-08 ENCOUNTER — Other Ambulatory Visit (HOSPITAL_COMMUNITY): Payer: Self-pay

## 2022-10-10 ENCOUNTER — Other Ambulatory Visit (HOSPITAL_COMMUNITY): Payer: Self-pay

## 2022-10-10 ENCOUNTER — Other Ambulatory Visit: Payer: Self-pay

## 2022-10-16 ENCOUNTER — Telehealth: Payer: Self-pay | Admitting: Cardiovascular Disease

## 2022-10-16 DIAGNOSIS — I214 Non-ST elevation (NSTEMI) myocardial infarction: Secondary | ICD-10-CM

## 2022-10-16 MED ORDER — CLOPIDOGREL BISULFATE 75 MG PO TABS
75.0000 mg | ORAL_TABLET | Freq: Every day | ORAL | 11 refills | Status: DC
Start: 2022-10-16 — End: 2023-09-15

## 2022-10-16 NOTE — Telephone Encounter (Signed)
Pt c/o medication issue:  1. Name of Medication:   ticagrelor (BRILINTA) 90 MG TABS tablet    2. How are you currently taking this medication (dosage and times per day)?   3. Are you having a reaction (difficulty breathing--STAT)?   4. What is your medication issue?  Patient states that this medication is going to be too much for him to pay and would like a call back to discuss other options for this. Please advise.

## 2022-10-16 NOTE — Telephone Encounter (Signed)
Plavix sent to pharmacy on file. Patient aware he will need to take 4 tablets (300 mg) the first day. Then he will take 1 tablet (75 mg) daily each day after. Verbalized understanding

## 2022-10-23 ENCOUNTER — Telehealth (HOSPITAL_COMMUNITY): Payer: Self-pay | Admitting: Surgery

## 2022-10-23 NOTE — Telephone Encounter (Signed)
.  Cardiac Rehab Medication Review by a Nurse   Does the patient  feel that his/her medications are working for him/her?  Yes  Has the patient been experiencing any side effects to the medications prescribed?  No  Does the patient measure his/her own blood pressure or blood glucose at home?  Pt checks his blood sugar 2-3 times a week, but he rarely checks his blood pressure at home.     Does the patient have any problems obtaining medications due to transportation or finances? No   Understanding of regimen: good Understanding of indications: good Potential of compliance: good      Nurse comments: Reviewed pt's medications over the phone prior to his orientation on Monday 10/27/22.

## 2022-10-27 ENCOUNTER — Encounter (HOSPITAL_COMMUNITY)
Admission: RE | Admit: 2022-10-27 | Discharge: 2022-10-27 | Disposition: A | Discharge status: Home / Self Care | Payer: 59 | Source: Ambulatory Visit | Attending: Cardiovascular Disease | Admitting: Cardiovascular Disease

## 2022-10-27 VITALS — Ht 68.2 in | Wt 191.7 lb

## 2022-10-27 DIAGNOSIS — Z955 Presence of coronary angioplasty implant and graft: Secondary | ICD-10-CM | POA: Insufficient documentation

## 2022-10-27 DIAGNOSIS — I214 Non-ST elevation (NSTEMI) myocardial infarction: Secondary | ICD-10-CM | POA: Insufficient documentation

## 2022-10-27 NOTE — Progress Notes (Addendum)
Cardiac Individual Treatment Plan  Patient Details  Name: Stephen [name] MRN: [id number] Date of Birth: 18-Jan-1956 Referring Provider:   Flowsheet Row CARDIAC REHAB PHASE II ORIENTATION from 10/27/2022 in [facility] CARDIAC REHABILITATION  Referring Provider Lennie Odor, MD       Initial Encounter Date:  Flowsheet Row CARDIAC REHAB PHASE II ORIENTATION from 10/27/2022 in [place] CARDIAC REHABILITATION  Date 10/27/22       Visit Diagnosis: NSTEMI (non-ST elevated myocardial infarction) [facility])  Status post coronary artery stent placement  Patient's Home Medications on Admission:  Current Outpatient Medications:    aspirin EC 81 MG tablet, Take 1 tablet (81 mg total) by mouth daily. Swallow whole., Disp: 120 tablet, Rfl: 2   atorvastatin (LIPITOR) 80 MG tablet, Take 1 tablet (80 mg total) by mouth daily., Disp: 90 tablet, Rfl: 3   clopidogrel (PLAVIX) 75 MG tablet, Take 1 tablet (75 mg total) by mouth daily. Take 4 tablets (300 mg) the first day. Then each day after take 1 tablet (75 mg) daily, Disp: 30 tablet, Rfl: 11   empagliflozin (JARDIANCE) 10 MG TABS tablet, Take 1 tablet (10 mg total) by mouth daily before breakfast., Disp: 30 tablet, Rfl: 11   esomeprazole (NEXIUM) 20 MG capsule, Take 20 mg by mouth daily at 12 noon., Disp: , Rfl:    Glucosamine Sulfate 1000 MG CAPS, Take 2 capsules by mouth daily., Disp: , Rfl:    metFORMIN (GLUCOPHAGE) 500 MG tablet, Take 1 tablet (500 mg total) by mouth daily., Disp: , Rfl:    metoprolol succinate (TOPROL XL) 25 MG 24 hr tablet, Take 1 tablet (25 mg total) by mouth daily., Disp: 30 tablet, Rfl: 11   nitroGLYCERIN (NITROSTAT) 0.4 MG SL tablet, Place 1 tablet (0.4 mg total) under the tongue every 5 (five) minutes x 3 doses as needed for chest pain., Disp: 25 tablet, Rfl: 1   polyethylene glycol powder (MIRALAX) 17 GM/SCOOP powder, Take 0.5 Containers by mouth as needed for moderate constipation., Disp: , Rfl:   Past Medical History: Past  Medical History:  Diagnosis Date   Cancer (HCC) 2008   prostate   History of inguinal hernia repair    right   Hyperlipidemia    Hypertension     Tobacco Use: Social History   Tobacco Use  Smoking Status Former  Smokeless Tobacco Never    Labs: Review Flowsheet       Latest Ref Rng & Units 09/14/2022 09/15/2022  Labs for ITP Cardiac and Pulmonary Rehab  Cholestrol 0 - 200 mg/dL 409  -  LDL (calc) 0 - 99 mg/dL 76  -  HDL-C >81 mg/dL 61  -  Trlycerides <191 mg/dL 37  -  Hemoglobin Y7W 4.8 - 5.6 % - 6.4     Capillary Blood Glucose: Lab Results  Component Value Date   GLUCAP 120 (H) 09/15/2022   GLUCAP 109 (H) 09/15/2022   GLUCAP 170 (H) 09/14/2022   GLUCAP 145 (H) 09/14/2022   GLUCAP 149 (H) 09/14/2022     Exercise Target Goals: Exercise Program Goal: Individual exercise prescription set using results from initial 6 min walk test and THRR while considering  patient's activity barriers and safety.   Exercise Prescription Goal: Starting with aerobic activity 30 plus minutes a day, 3 days per week for initial exercise prescription. Provide home exercise prescription and guidelines that participant acknowledges understanding prior to discharge.  Activity Barriers & Risk Stratification:  Activity Barriers & Cardiac Risk Stratification - 10/27/22 1306  Activity Barriers & Cardiac Risk Stratification   Activity Barriers None    Cardiac Risk Stratification Moderate             6 Minute Walk:  6 Minute Walk     Row Name 10/27/22 1425         6 Minute Walk   Phase Initial     Distance 1618 feet     Walk Time 6 minutes     # of Rest Breaks 0     MPH 3.06     METS 3.4     RPE 9     VO2 Peak 11.89     Symptoms No     Resting HR 65 bpm     Resting BP 110/60     Resting Oxygen Saturation  98 %     Exercise Oxygen Saturation  during 6 min walk 98 %     Max Ex. HR 75 bpm     Max Ex. BP 128/64     2 Minute Post BP 108/64              Oxygen  Initial Assessment:   Oxygen Re-Evaluation:   Oxygen Discharge (Final Oxygen Re-Evaluation):   Initial Exercise Prescription:  Initial Exercise Prescription - 10/27/22 1400       Date of Initial Exercise RX and Referring Provider   Date 10/27/22    Referring Provider Lennie Odor, MD      Oxygen   Maintain Oxygen Saturation 88% or higher      Treadmill   MPH 3    Grade 1    Minutes 15    METs 3.71      NuStep   Level 3    SPM 80    Minutes 15    METs 3      Prescription Details   Frequency (times per week) 3    Duration Progress to 30 minutes of continuous aerobic without signs/symptoms of physical distress      Intensity   THRR 40-80% of Max Heartrate 101-137    Ratings of Perceived Exertion 11-13    Perceived Dyspnea 0-4      Progression   Progression Continue to progress workloads to maintain intensity without signs/symptoms of physical distress.      Resistance Training   Training Prescription Yes    Weight 4 lb    Reps 10-15             Perform Capillary Blood Glucose checks as needed.  Exercise Prescription Changes:   Exercise Prescription Changes     Row Name 10/27/22 1400             Response to Exercise   Blood Pressure (Admit) 110/60       Blood Pressure (Exercise) 128/66       Blood Pressure (Exit) 108/64       Heart Rate (Admit) 65 bpm       Heart Rate (Exercise) 75 bpm       Heart Rate (Exit) 72 bpm       Oxygen Saturation (Admit) 98 %       Oxygen Saturation (Exercise) 98 %       Rating of Perceived Exertion (Exercise) 9       Symptoms none       Comments walk test results                Exercise Comments:   Exercise Goals and Review:   Exercise  Goals     Row Name 10/27/22 1525             Exercise Goals   Increase Physical Activity Yes       Intervention Provide advice, education, support and counseling about physical activity/exercise needs.;Develop an individualized exercise prescription for  aerobic and resistive training based on initial evaluation findings, risk stratification, comorbidities and participant's personal goals.       Expected Outcomes Short Term: Attend rehab on a regular basis to increase amount of physical activity.;Long Term: Exercising regularly at least 3-5 days a week.;Long Term: Add in home exercise to make exercise part of routine and to increase amount of physical activity.       Increase Strength and Stamina Yes       Intervention Provide advice, education, support and counseling about physical activity/exercise needs.;Develop an individualized exercise prescription for aerobic and resistive training based on initial evaluation findings, risk stratification, comorbidities and participant's personal goals.       Expected Outcomes Short Term: Increase workloads from initial exercise prescription for resistance, speed, and METs.;Short Term: Perform resistance training exercises routinely during rehab and add in resistance training at home;Long Term: Improve cardiorespiratory fitness, muscular endurance and strength as measured by increased METs and functional capacity ( )       Able to understand and use rate of perceived exertion (RPE) scale Yes       Intervention Provide education and explanation on how to use RPE scale       Expected Outcomes Long Term:  Able to use RPE to guide intensity level when exercising independently;Short Term: Able to use RPE daily in rehab to express subjective intensity level       Able to understand and use Dyspnea scale Yes       Intervention Provide education and explanation on how to use Dyspnea scale       Expected Outcomes Short Term: Able to use Dyspnea scale daily in rehab to express subjective sense of shortness of breath during exertion;Long Term: Able to use Dyspnea scale to guide intensity level when exercising independently       Knowledge and understanding of Target Heart Rate Range (THRR) Yes       Intervention Provide  education and explanation of THRR including how the numbers were predicted and where they are located for reference       Expected Outcomes Short Term: Able to state/look up THRR;Short Term: Able to use daily as guideline for intensity in rehab;Long Term: Able to use THRR to govern intensity when exercising independently       Able to check pulse independently Yes       Intervention Provide education and demonstration on how to check pulse in carotid and radial arteries.;Review the importance of being able to check your own pulse for safety during independent exercise       Expected Outcomes Short Term: Able to explain why pulse checking is important during independent exercise;Long Term: Able to check pulse independently and accurately       Understanding of Exercise Prescription Yes       Intervention Provide education, explanation, and written materials on patient's individual exercise prescription       Expected Outcomes Short Term: Able to explain program exercise prescription;Long Term: Able to explain home exercise prescription to exercise independently                Exercise Goals Re-Evaluation :    Discharge Exercise Prescription (Final Exercise  Prescription Changes):  Exercise Prescription Changes - 10/27/22 1400       Response to Exercise   Blood Pressure (Admit) 110/60    Blood Pressure (Exercise) 128/66    Blood Pressure (Exit) 108/64    Heart Rate (Admit) 65 bpm    Heart Rate (Exercise) 75 bpm    Heart Rate (Exit) 72 bpm    Oxygen Saturation (Admit) 98 %    Oxygen Saturation (Exercise) 98 %    Rating of Perceived Exertion (Exercise) 9    Symptoms none    Comments walk test results             Nutrition:  Target Goals: Understanding of nutrition guidelines, daily intake of sodium 1500mg , cholesterol 200mg , calories 30% from fat and 7% or less from saturated fats, daily to have 5 or more servings of fruits and vegetables.  Biometrics:  Pre Biometrics -  10/27/22 1526       Pre Biometrics   Height 5' 8.2" (1.732 m)    Weight 87 kg    Waist Circumference 35 inches    Hip Circumference 39 inches    Waist to Hip Ratio 0.9 %    BMI (Calculated) 28.99    Triceps Skinfold 12 mm    Grip Strength 38.9 kg    Single Leg Stand 30 seconds              Nutrition Therapy Plan and Nutrition Goals:  Nutrition Therapy & Goals - 10/27/22 1351       Nutrition Therapy   RD appointment deferred Yes      Personal Nutrition Goals   Comments Patient scored 39 on his diet assessment. He says his mother cooks for him and they try to limit salt and sweets. Handout provided and explained regarding healthier choices.      Intervention Plan   Intervention Nutrition handout(s) given to patient.    Expected Outcomes Short Term Goal: Understand basic principles of dietary content, such as calories, fat, sodium, cholesterol and nutrients.             Nutrition Assessments:  Nutrition Assessments - 10/27/22 1351       MEDFICTS Scores   Pre Score 39            MEDIFICTS Score Key: ?70 Need to make dietary changes  40-70 Heart Healthy Diet ? 40 Therapeutic Level Cholesterol Diet   Picture Your Plate Scores: <09 Unhealthy dietary pattern with much room for improvement. 41-50 Dietary pattern unlikely to meet recommendations for good health and room for improvement. 51-60 More healthful dietary pattern, with some room for improvement.  >60 Healthy dietary pattern, although there may be some specific behaviors that could be improved.    Nutrition Goals Re-Evaluation:   Nutrition Goals Discharge (Final Nutrition Goals Re-Evaluation):   Psychosocial: Target Goals: Acknowledge presence or absence of significant depression and/or stress, maximize coping skills, provide positive support system. Participant is able to verbalize types and ability to use techniques and skills needed for reducing stress and depression.  Initial Review &  Psychosocial Screening:  Initial Psych Review & Screening - 10/27/22 1412       Initial Review   Current issues with None Identified      Family Dynamics   Good Support System? Yes      Barriers   Psychosocial barriers to participate in program There are no identifiable barriers or psychosocial needs.;The patient should benefit from training in stress management and relaxation.  Screening Interventions   Interventions Encouraged to exercise;To provide support and resources with identified psychosocial needs;Provide feedback about the scores to participant    Expected Outcomes Short Term goal: Utilizing psychosocial counselor, staff and physician to assist with identification of specific Stressors or current issues interfering with healing process. Setting desired goal for each stressor or current issue identified.;Long Term Goal: Stressors or current issues are controlled or eliminated.;Short Term goal: Identification and review with participant of any Quality of Life or Depression concerns found by scoring the questionnaire.             Quality of Life Scores:  Quality of Life - 10/27/22 1539       Quality of Life   Select Quality of Life      Quality of Life Scores   Health/Function Pre 26.47 %    Socioeconomic Pre 29.64 %    Psych/Spiritual Pre 30 %    Family Pre 27.2 %    GLOBAL Pre 29.94 %            Scores of 19 and below usually indicate a poorer quality of life in these areas.  A difference of  2-3 points is a clinically meaningful difference.  A difference of 2-3 points in the total score of the Quality of Life Index has been associated with significant improvement in overall quality of life, self-image, physical symptoms, and general health in studies assessing change in quality of life.  PHQ-9: Review Flowsheet       10/27/2022  Depression screen PHQ 2/9  Decreased Interest 0  Down, Depressed, Hopeless 0  PHQ - 2 Score 0  Altered sleeping 0  Tired,  decreased energy 0  Change in appetite 0  Feeling bad or failure about yourself  0  Trouble concentrating 0  Moving slowly or fidgety/restless 0  Suicidal thoughts 0  PHQ-9 Score 0  Difficult doing work/chores Not difficult at all   Interpretation of Total Score  Total Score Depression Severity:  1-4 = Minimal depression, 5-9 = Mild depression, 10-14 = Moderate depression, 15-19 = Moderately severe depression, 20-27 = Severe depression   Psychosocial Evaluation and Intervention:  Psychosocial Evaluation - 10/27/22 1412       Psychosocial Evaluation & Interventions   Interventions Stress management education;Relaxation education;Encouraged to exercise with the program and follow exercise prescription    Comments Patient has no psychosocial barriers or issues identified at his orientation visit. His PHQ-9 score was 0. 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 He lives with his mother. He has a good support system with his mother and 2 adult sons. He is ready to start the program hoping to get back to his normal activities.    Expected Outcomes Patient will continue to have no psychosocial barriers identified.    Continue Psychosocial Services  No Follow up required             Psychosocial Re-Evaluation:   Psychosocial Discharge (Final Psychosocial Re-Evaluation):   Vocational Rehabilitation: Provide vocational rehab assistance to qualifying candidates.   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 He does not need vocational rehab.  Education: Education Goals: Education classes will be provided on a weekly basis, covering required topics. Participant will state  understanding/return demonstration of topics presented.  Learning Barriers/Preferences:  Learning Barriers/Preferences - 10/27/22 1354       Learning Barriers/Preferences   Learning Barriers None    Learning Preferences Written Material;Audio             Education Topics: Hypertension, Hypertension Reduction -Define heart disease and high blood pressure. Discus how high blood pressure affects the body and ways to reduce high blood pressure.   Exercise and Your Heart -Discuss why it is important to exercise, the FITT principles of exercise, normal and abnormal responses to exercise, and how to exercise safely.   Angina -Discuss definition of angina, causes of angina, treatment of angina, and how to decrease risk of having angina.   Cardiac Medications -Review what the following cardiac medications are used for, how they affect the body, and side effects that may occur when taking the medications.  Medications include Aspirin, Beta blockers, calcium channel blockers, ACE Inhibitors, angiotensin receptor blockers, diuretics, digoxin, and antihyperlipidemics.   Congestive Heart Failure -Discuss the definition of CHF, how to live with CHF, the signs and symptoms of CHF, and how keep track of weight and sodium intake.   Heart Disease and Intimacy -Discus the effect sexual activity has on the heart, how changes occur during intimacy as we age, and safety during sexual activity.   Smoking Cessation / COPD -Discuss different methods to quit smoking, the health benefits of quitting smoking, and the definition of COPD.   Nutrition I: Fats -Discuss the types of cholesterol, what cholesterol does to the heart, and how cholesterol levels can be controlled.   Nutrition II: Labels -Discuss the different components of food labels and how to read food label   Heart Parts/Heart Disease and PAD -Discuss the anatomy of the heart, the pathway of blood circulation through the heart,  and these are affected by heart disease.   Stress I: Signs and Symptoms -Discuss the causes of stress, how stress may lead to anxiety and depression, and ways to limit stress.   Stress II: Relaxation -Discuss different types of relaxation techniques to limit stress.   Warning Signs of Stroke / TIA -Discuss definition of a stroke, what the signs and symptoms are of a stroke, and how to identify when someone is having stroke.   Knowledge Questionnaire Score:  Knowledge Questionnaire Score - 10/27/22 1354       Knowledge Questionnaire Score   Pre Score 19/24             Core Components/Risk Factors/Patient Goals at Admission:  Personal Goals and Risk Factors at Admission - 10/27/22 1356       Core Components/Risk Factors/Patient Goals on Admission    Weight Management Weight Maintenance    Diabetes Yes    Intervention Provide education about signs/symptoms and action to take for hypo/hyperglycemia.;Provide education about proper nutrition, including hydration, and aerobic/resistive exercise prescription along with prescribed medications to achieve blood glucose in normal ranges: Fasting glucose 65-99 mg/dL    Expected Outcomes Short Term: Participant verbalizes understanding of the signs/symptoms and immediate care of hyper/hypoglycemia, proper foot care and importance of medication, aerobic/resistive exercise and nutrition plan for blood glucose control.;Long Term: Attainment of HbA1C < 7%.    Hypertension Yes    Intervention Provide education on lifestyle modifcations including regular physical activity/exercise, weight management, moderate sodium restriction and increased consumption of fresh fruit, vegetables, and low fat  dairy, alcohol moderation, and smoking cessation.;Monitor prescription use compliance.    Expected Outcomes Long Term: Maintenance of blood pressure at goal levels.;Short Term: Continued assessment and intervention until BP is < 140/73mm HG in hypertensive  participants. < 130/57mm HG in hypertensive participants with diabetes, heart failure or chronic kidney disease.    Lipids Yes    Intervention Provide education and support for participant on nutrition & aerobic/resistive exercise along with prescribed medications to achieve LDL 70mg , HDL >40mg .    Expected Outcomes Short Term: Participant states understanding of desired cholesterol values and is compliant with medications prescribed. Participant is following exercise prescription and nutrition guidelines.;Long Term: Cholesterol controlled with medications as prescribed, with individualized exercise RX and with personalized nutrition plan. Value goals: LDL < 70mg , HDL > 40 mg.    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    Intervention Patient will attend CR 3 days/week with exercise and education.    Expected Outcomes Patient will complete the program meeting both personal and program goals.             Core Components/Risk Factors/Patient Goals Review:    Core Components/Risk Factors/Patient Goals at Discharge (Final Review):    ITP Comments:   Comments: Patient arrived for 1st visit/orientation/education at 1230. Patient was referred to CR by Dr. Eugenio Hoes due to NSTEMI (I21.4) and Status Post Coronary Artery Stent Placement (Z95.5). During orientation advised patient on arrival and appointment times what to wear, what to do before, during and after exercise. Reviewed attendance and class policy.  Pt is scheduled to return Cardiac Rehab on 10/29/22 at 0930. Pt was advised to come to class 15 minutes before class starts.  Discussed RPE/Dpysnea scales. Patient participated in warm up stretches. Patient was able to complete 6 minute walk test.  Telemetry:NSR. Patient was measured for the equipment. Discussed equipment safety with patient. Took patient pre-anthropometric measurements.  Patient finished visit at 1415.

## 2022-10-29 ENCOUNTER — Encounter (HOSPITAL_COMMUNITY)
Admission: RE | Admit: 2022-10-29 | Discharge: 2022-10-29 | Disposition: A | Discharge status: Home / Self Care | Payer: 59 | Source: Ambulatory Visit | Attending: Thoracic Surgery (Cardiothoracic Vascular Surgery) | Admitting: Thoracic Surgery (Cardiothoracic Vascular Surgery)

## 2022-10-29 DIAGNOSIS — Z955 Presence of coronary angioplasty implant and graft: Secondary | ICD-10-CM

## 2022-10-29 DIAGNOSIS — I214 Non-ST elevation (NSTEMI) myocardial infarction: Secondary | ICD-10-CM

## 2022-10-29 LAB — GLUCOSE, CAPILLARY
Glucose-Capillary: 181 mg/dL — ABNORMAL HIGH (ref 70–99)
Glucose-Capillary: 115 mg/dL — ABNORMAL HIGH (ref 70–99)

## 2022-10-29 NOTE — Progress Notes (Signed)
Daily Session Note  Patient Details  Name: Stephen [name] MRN: [id number] Date of Birth: Jun 10, 1955 Referring Provider:   Flowsheet Row CARDIAC REHAB PHASE II ORIENTATION from 10/27/2022 in [facility] CARDIAC REHABILITATION  Referring Provider Lennie Odor, MD       Encounter Date: 10/29/2022  Check In:  Session Check In - 10/29/22 4098       Check-In   Supervising physician immediately available to respond to emergencies [facility] MD immediately available    Physician(s) Dr. Dominga Ferry    Location [facility]-Card[facility] Pul[facility]    Staff Present Rodena Medin, RN, BSN;Heather Fredric Mare, BS, Exercise Physiologist    Virtual Visit No    Medication changes reported     Yes    Comments added losartan 50 mg    Fall or balance concerns reported    No    Warm-up and Cool-down Performed as group-led instruction    Resistance Training Performed Yes    VAD Patient? No    PAD/SET Patient? No      Pain Assessment   Currently in Pain? No/denies             Capillary Blood Glucose: No results found for this or any previous visit (from the past 24 hour(s)).    Social History   Tobacco Use  Smoking Status Former  Smokeless Tobacco Never    Goals Met:  Exercise tolerated well No report of concerns or symptoms today Strength training completed today  Goals Unmet:  Not Applicable  Comments: First full day of exercise!  Patient was oriented to gym and equipment including functions, settings, policies, and procedures.  Pt able to follow exercise prescription today without complaint.  Will continue to monitor for progression.    Dr. Dina Rich is Medical Director for [facility] Cardiac Rehab

## 2022-10-31 ENCOUNTER — Encounter (HOSPITAL_COMMUNITY)
Admission: RE | Admit: 2022-10-31 | Discharge: 2022-10-31 | Disposition: A | Discharge status: Home / Self Care | Payer: 59 | Source: Ambulatory Visit | Attending: Thoracic Surgery (Cardiothoracic Vascular Surgery) | Admitting: Thoracic Surgery (Cardiothoracic Vascular Surgery)

## 2022-10-31 DIAGNOSIS — Z955 Presence of coronary angioplasty implant and graft: Secondary | ICD-10-CM

## 2022-10-31 DIAGNOSIS — I214 Non-ST elevation (NSTEMI) myocardial infarction: Secondary | ICD-10-CM | POA: Diagnosis not present

## 2022-10-31 LAB — GLUCOSE, CAPILLARY
Glucose-Capillary: 139 mg/dL — ABNORMAL HIGH (ref 70–99)
Glucose-Capillary: 170 mg/dL — ABNORMAL HIGH (ref 70–99)

## 2022-11-03 ENCOUNTER — Encounter (HOSPITAL_COMMUNITY)
Admission: RE | Admit: 2022-11-03 | Discharge: 2022-11-03 | Disposition: A | Discharge status: Home / Self Care | Payer: 59 | Source: Ambulatory Visit | Attending: Thoracic Surgery (Cardiothoracic Vascular Surgery) | Admitting: Thoracic Surgery (Cardiothoracic Vascular Surgery)

## 2022-11-03 DIAGNOSIS — Z955 Presence of coronary angioplasty implant and graft: Secondary | ICD-10-CM

## 2022-11-03 DIAGNOSIS — I214 Non-ST elevation (NSTEMI) myocardial infarction: Secondary | ICD-10-CM | POA: Diagnosis not present

## 2022-11-03 LAB — GLUCOSE, CAPILLARY
Glucose-Capillary: 164 mg/dL — ABNORMAL HIGH (ref 70–99)
Glucose-Capillary: 93 mg/dL (ref 70–99)

## 2022-11-03 NOTE — Progress Notes (Signed)
Daily Session Note  Patient Details  Name: Stephen [name] MRN: [id number] Date of Birth: March 08, 1956 Referring Provider:   Flowsheet Row CARDIAC REHAB PHASE II ORIENTATION from 10/27/2022 in [facility] CARDIAC REHABILITATION  Referring Provider Lennie Odor, MD       Encounter Date: 11/03/2022  Check In:  Session Check In - 11/03/22 0930       Check-In   Supervising physician immediately available to respond to emergencies [facility] MD immediately available    Physician(s) Dr. Diona Browner    Location [facility]    Staff Present Ross Ludwig, BS, Exercise Physiologist;Jacorey Donaway Laural Benes, RN, BSN    Virtual Visit No    Medication changes reported     No    Fall or balance concerns reported    No    Warm-up and Cool-down Performed as group-led Writer Performed Yes    VAD Patient? No    PAD/SET Patient? No      Pain Assessment   Currently in Pain? No/denies    Multiple Pain Sites No             Capillary Blood Glucose: No results found for this or any previous visit (from the past 24 hour(s)).    Social History   Tobacco Use  Smoking Status Former  Smokeless Tobacco Never    Goals Met:  Independence with exercise equipment Exercise tolerated well No report of concerns or symptoms today Strength training completed today  Goals Unmet:  Not Applicable  Comments: Check out 1030.   Dr. Dina Rich is Medical Director for [facility] Cardiac Rehab

## 2022-11-05 ENCOUNTER — Encounter (HOSPITAL_COMMUNITY)
Admission: RE | Admit: 2022-11-05 | Discharge: 2022-11-05 | Disposition: A | Discharge status: Home / Self Care | Payer: 59 | Source: Ambulatory Visit | Attending: Thoracic Surgery (Cardiothoracic Vascular Surgery) | Admitting: Thoracic Surgery (Cardiothoracic Vascular Surgery)

## 2022-11-05 DIAGNOSIS — Z955 Presence of coronary angioplasty implant and graft: Secondary | ICD-10-CM

## 2022-11-05 DIAGNOSIS — I214 Non-ST elevation (NSTEMI) myocardial infarction: Secondary | ICD-10-CM | POA: Diagnosis not present

## 2022-11-05 NOTE — Progress Notes (Signed)
Daily Session Note  Patient Details  Name: Stephen [name] MRN: [id number] Date of Birth: 1955/10/24 Referring Provider:   Flowsheet Row CARDIAC REHAB PHASE II ORIENTATION from 10/27/2022 in [facility] CARDIAC REHABILITATION  Referring Provider Lennie Odor, MD       Encounter Date: 11/05/2022  Check In:  Session Check In - 11/05/22 0930       Check-In   Supervising physician immediately available to respond to emergencies [facility] MD immediately available    Physician(s) Dr. Diona Browner    Location [facility]    Staff Present Fabio Pierce, MA, RCEP, CCRP, Dow Adolph, RN, BSN    Virtual Visit No    Medication changes reported     No    Fall or balance concerns reported    No    Warm-up and Cool-down Performed as group-led instruction    VAD Patient? No    PAD/SET Patient? No      Pain Assessment   Currently in Pain? No/denies    Multiple Pain Sites No             Capillary Blood Glucose: No results found for this or any previous visit (from the past 24 hour(s)).    Social History   Tobacco Use  Smoking Status Former  Smokeless Tobacco Never    Goals Met:  Independence with exercise equipment Exercise tolerated well No report of concerns or symptoms today Strength training completed today  Goals Unmet:  Not Applicable  Comments: Check out 1030.   Dr. Dina Rich is Medical Director for [facility] Cardiac Rehab

## 2022-11-07 ENCOUNTER — Encounter (HOSPITAL_COMMUNITY)
Admission: RE | Admit: 2022-11-07 | Discharge: 2022-11-07 | Disposition: A | Discharge status: Home / Self Care | Payer: 59 | Source: Ambulatory Visit | Attending: Thoracic Surgery (Cardiothoracic Vascular Surgery) | Admitting: Thoracic Surgery (Cardiothoracic Vascular Surgery)

## 2022-11-07 DIAGNOSIS — I214 Non-ST elevation (NSTEMI) myocardial infarction: Secondary | ICD-10-CM

## 2022-11-07 DIAGNOSIS — Z955 Presence of coronary angioplasty implant and graft: Secondary | ICD-10-CM

## 2022-11-07 NOTE — Progress Notes (Addendum)
Daily Session Note  Patient Details  Name: Stephen [name] MRN: [id number] Date of Birth: January 26, 1956 Referring Provider:   Flowsheet Row CARDIAC REHAB PHASE II ORIENTATION from 10/27/2022 in [facility] CARDIAC REHABILITATION  Referring Provider Lennie Odor, MD       Encounter Date: 11/07/2022  Check In:  Session Check In - 11/07/22 0928       Check-In   Supervising physician immediately available to respond to emergencies [facility] MD immediately available    Physician(s) Dr. Dominga Ferry    Location [facility]    Staff Present Fabio Pierce, MA, RCEP, CCRP, Dow Adolph, RN, BSN    Virtual Visit No    Medication changes reported     No    Fall or balance concerns reported    No    Warm-up and Cool-down Performed on first and last piece of equipment    Resistance Training Performed Yes    VAD Patient? No    PAD/SET Patient? No      Pain Assessment   Currently in Pain? No/denies             Capillary Blood Glucose: No results found for this or any previous visit (from the past 24 hour(s)).    Social History   Tobacco Use  Smoking Status Former  Smokeless Tobacco Never    Goals Met:  Independence with exercise equipment Exercise tolerated well No report of concerns or symptoms today Strength training completed today  Goals Unmet:  Not Applicable  Comments: Pt able to follow exercise prescription today without complaint.  Will continue to monitor for progression. Talked about trying out intervals next week.    Dr. Dina Rich is Medical Director for [facility] Cardiac Rehab

## 2022-11-10 ENCOUNTER — Encounter (HOSPITAL_COMMUNITY)
Admission: RE | Admit: 2022-11-10 | Discharge: 2022-11-10 | Disposition: A | Discharge status: Home / Self Care | Payer: 59 | Source: Ambulatory Visit | Attending: Thoracic Surgery (Cardiothoracic Vascular Surgery) | Admitting: Thoracic Surgery (Cardiothoracic Vascular Surgery)

## 2022-11-10 DIAGNOSIS — I214 Non-ST elevation (NSTEMI) myocardial infarction: Secondary | ICD-10-CM | POA: Diagnosis not present

## 2022-11-10 NOTE — Progress Notes (Signed)
Daily Session Note  Patient Details  Name: Stephen [name] MRN: [id number] Date of Birth: [birth year] Referring Provider:   Flowsheet Row CARDIAC REHAB PHASE II ORIENTATION from 10/27/2022 in [facility] CARDIAC REHABILITATION  Referring Provider Lennie Odor, MD       Encounter Date: 11/10/2022  Check In:  Session Check In - 11/10/22 0915       Check-In   Supervising physician immediately available to respond to emergencies [facility] MD immediately available    Physician(s) Dr. Jenene Slicker    Location [facility]    Staff Present Ross Ludwig, BS, Exercise Physiologist;Jessica Juanetta Gosling, MA, RCEP, CCRP, CCET;Daphyne Daphine Deutscher, RN, BSN    Virtual Visit No    Medication changes reported     No    Fall or balance concerns reported    No    Tobacco Cessation No Change    Warm-up and Cool-down Performed on first and last piece of equipment    Resistance Training Performed Yes    VAD Patient? No    PAD/SET Patient? No      Pain Assessment   Currently in Pain? No/denies    Multiple Pain Sites No             Capillary Blood Glucose: No results found for this or any previous visit (from the past 24 hour(s)).    Social History   Tobacco Use  Smoking Status Former  Smokeless Tobacco Never    Goals Met:  Independence with exercise equipment Exercise tolerated well No report of concerns or symptoms today Strength training completed today  Goals Unmet:  Not Applicable  Comments: Pt able to follow exercise prescription today without complaint.  Will continue to monitor for progression.    Dr. Dina Rich is Medical Director for [facility] Cardiac Rehab

## 2022-11-12 ENCOUNTER — Encounter (HOSPITAL_COMMUNITY)
Admission: RE | Admit: 2022-11-12 | Discharge: 2022-11-12 | Disposition: A | Discharge status: Home / Self Care | Payer: 59 | Source: Ambulatory Visit | Attending: Thoracic Surgery (Cardiothoracic Vascular Surgery) | Admitting: Thoracic Surgery (Cardiothoracic Vascular Surgery)

## 2022-11-12 ENCOUNTER — Telehealth: Payer: Self-pay | Admitting: Cardiovascular Disease

## 2022-11-12 ENCOUNTER — Encounter (HOSPITAL_COMMUNITY): Payer: Self-pay | Admitting: *Deleted

## 2022-11-12 DIAGNOSIS — I214 Non-ST elevation (NSTEMI) myocardial infarction: Secondary | ICD-10-CM

## 2022-11-12 DIAGNOSIS — Z955 Presence of coronary angioplasty implant and graft: Secondary | ICD-10-CM

## 2022-11-12 NOTE — Progress Notes (Signed)
Cardiac Individual Treatment Plan  Patient Details  Name: Stephen [name] MRN: [id number] Date of Birth: 12/30/1955 Referring Provider:   Flowsheet Row CARDIAC REHAB PHASE II ORIENTATION from 10/27/2022 in [facility] CARDIAC REHABILITATION  Referring Provider Lennie Odor, MD       Initial Encounter Date:  Flowsheet Row CARDIAC REHAB PHASE II ORIENTATION from 10/27/2022 in [place] CARDIAC REHABILITATION  Date 10/27/22       Visit Diagnosis: NSTEMI (non-ST elevated myocardial infarction) [facility])  Status post coronary artery stent placement  Patient's Home Medications on Admission:  Current Outpatient Medications:    aspirin EC 81 MG tablet, Take 1 tablet (81 mg total) by mouth daily. Swallow whole., Disp: 120 tablet, Rfl: 2   atorvastatin (LIPITOR) 80 MG tablet, Take 1 tablet (80 mg total) by mouth daily., Disp: 90 tablet, Rfl: 3   clopidogrel (PLAVIX) 75 MG tablet, Take 1 tablet (75 mg total) by mouth daily. Take 4 tablets (300 mg) the first day. Then each day after take 1 tablet (75 mg) daily, Disp: 30 tablet, Rfl: 11   empagliflozin (JARDIANCE) 10 MG TABS tablet, Take 1 tablet (10 mg total) by mouth daily before breakfast., Disp: 30 tablet, Rfl: 11   esomeprazole (NEXIUM) 20 MG capsule, Take 20 mg by mouth daily at 12 noon., Disp: , Rfl:    Glucosamine Sulfate 1000 MG CAPS, Take 2 capsules by mouth daily., Disp: , Rfl:    losartan (COZAAR) 50 MG tablet, 1 tablet Orally Once a day for 90 days, Disp: , Rfl:    metFORMIN (GLUCOPHAGE) 500 MG tablet, Take 1 tablet (500 mg total) by mouth daily., Disp: , Rfl:    metoprolol succinate (TOPROL XL) 25 MG 24 hr tablet, Take 1 tablet (25 mg total) by mouth daily., Disp: 30 tablet, Rfl: 11   nitroGLYCERIN (NITROSTAT) 0.4 MG SL tablet, Place 1 tablet (0.4 mg total) under the tongue every 5 (five) minutes x 3 doses as needed for chest pain., Disp: 25 tablet, Rfl: 1   polyethylene glycol powder (MIRALAX) 17 GM/SCOOP powder, Take 0.5 Containers by  mouth as needed for moderate constipation., Disp: , Rfl:   Past Medical History: Past Medical History:  Diagnosis Date   Cancer (HCC) 2008   prostate   History of inguinal hernia repair    right   Hyperlipidemia    Hypertension     Tobacco Use: Social History   Tobacco Use  Smoking Status Former  Smokeless Tobacco Never    Labs: Review Flowsheet       Latest Ref Rng & Units 09/14/2022 09/15/2022  Labs for ITP Cardiac and Pulmonary Rehab  Cholestrol 0 - 200 mg/dL 528  -  LDL (calc) 0 - 99 mg/dL 76  -  HDL-C >41 mg/dL 61  -  Trlycerides <324 mg/dL 37  -  Hemoglobin M0N 4.8 - 5.6 % - 6.4     Details            Capillary Blood Glucose: Lab Results  Component Value Date   GLUCAP 93 11/03/2022   GLUCAP 164 (H) 11/03/2022   GLUCAP 139 (H) 10/31/2022   GLUCAP 170 (H) 10/31/2022   GLUCAP 115 (H) 10/29/2022     Exercise Target Goals: Exercise Program Goal: Individual exercise prescription set using results from initial 6 min walk test and THRR while considering  patient's activity barriers and safety.   Exercise Prescription Goal: Starting with aerobic activity 30 plus minutes a day, 3 days per week for initial exercise  prescription. Provide home exercise prescription and guidelines that participant acknowledges understanding prior to discharge.  Activity Barriers & Risk Stratification:  Activity Barriers & Cardiac Risk Stratification - 10/27/22 1306       Activity Barriers & Cardiac Risk Stratification   Activity Barriers None    Cardiac Risk Stratification Moderate             6 Minute Walk:  6 Minute Walk     Row Name 10/27/22 1425         6 Minute Walk   Phase Initial     Distance 1618 feet     Walk Time 6 minutes     # of Rest Breaks 0     MPH 3.06     METS 3.4     RPE 9     VO2 Peak 11.89     Symptoms No     Resting HR 65 bpm     Resting BP 110/60     Resting Oxygen Saturation  98 %     Exercise Oxygen Saturation  during 6 min walk  98 %     Max Ex. HR 75 bpm     Max Ex. BP 128/64     2 Minute Post BP 108/64              Oxygen Initial Assessment:   Oxygen Re-Evaluation:   Oxygen Discharge (Final Oxygen Re-Evaluation):   Initial Exercise Prescription:  Initial Exercise Prescription - 10/27/22 1400       Date of Initial Exercise RX and Referring Provider   Date 10/27/22    Referring Provider Lennie Odor, MD      Oxygen   Maintain Oxygen Saturation 88% or higher      Treadmill   MPH 3    Grade 1    Minutes 15    METs 3.71      NuStep   Level 3    SPM 80    Minutes 15    METs 3      Prescription Details   Frequency (times per week) 3    Duration Progress to 30 minutes of continuous aerobic without signs/symptoms of physical distress      Intensity   THRR 40-80% of Max Heartrate 101-137    Ratings of Perceived Exertion 11-13    Perceived Dyspnea 0-4      Progression   Progression Continue to progress workloads to maintain intensity without signs/symptoms of physical distress.      Resistance Training   Training Prescription Yes    Weight 4 lb    Reps 10-15             Perform Capillary Blood Glucose checks as needed.  Exercise Prescription Changes:   Exercise Prescription Changes     Row Name 10/27/22 1400 11/07/22 0915           Response to Exercise   Blood Pressure (Admit) 110/60 126/64      Blood Pressure (Exercise) 128/66 134/72      Blood Pressure (Exit) 108/64 114/60      Heart Rate (Admit) 65 bpm 84 bpm      Heart Rate (Exercise) 75 bpm 116 bpm      Heart Rate (Exit) 72 bpm 88 bpm      Oxygen Saturation (Admit) 98 % --      Oxygen Saturation (Exercise) 98 % --      Rating of Perceived Exertion (Exercise) 9 12  Symptoms none --      Comments walk test results --      Duration -- Continue with 30 min of aerobic exercise without signs/symptoms of physical distress.      Intensity -- THRR unchanged        Progression   Progression -- Continue to  progress workloads to maintain intensity without signs/symptoms of physical distress.        Resistance Training   Training Prescription -- Yes      Weight -- 4      Reps -- 10-15        Treadmill   MPH -- 3.3      Grade -- 2.5      Minutes -- 15      METs -- 4.66        NuStep   Level -- 4      Minutes -- 15      METs -- 2.9        Oxygen   Maintain Oxygen Saturation -- 88% or higher               Exercise Comments:   Exercise Comments     Row Name 10/29/22 0935 11/07/22 1002         Exercise Comments First full day of exercise!  Patient was oriented to gym and equipment including functions, settings, policies, and procedures. Talked about trying out intervals next week.               Exercise Goals and Review:   Exercise Goals     Row Name 10/27/22 1525 11/10/22 0847           Exercise Goals   Increase Physical Activity Yes Yes      Intervention Provide advice, education, support and counseling about physical activity/exercise needs.;Develop an individualized exercise prescription for aerobic and resistive training based on initial evaluation findings, risk stratification, comorbidities and participant's personal goals. Provide advice, education, support and counseling about physical activity/exercise needs.;Develop an individualized exercise prescription for aerobic and resistive training based on initial evaluation findings, risk stratification, comorbidities and participant's personal goals.      Expected Outcomes Short Term: Attend rehab on a regular basis to increase amount of physical activity.;Long Term: Exercising regularly at least 3-5 days a week.;Long Term: Add in home exercise to make exercise part of routine and to increase amount of physical activity. Short Term: Attend rehab on a regular basis to increase amount of physical activity.;Long Term: Exercising regularly at least 3-5 days a week.;Long Term: Add in home exercise to make exercise part  of routine and to increase amount of physical activity.      Increase Strength and Stamina Yes Yes      Intervention Provide advice, education, support and counseling about physical activity/exercise needs.;Develop an individualized exercise prescription for aerobic and resistive training based on initial evaluation findings, risk stratification, comorbidities and participant's personal goals. Provide advice, education, support and counseling about physical activity/exercise needs.;Develop an individualized exercise prescription for aerobic and resistive training based on initial evaluation findings, risk stratification, comorbidities and participant's personal goals.      Expected Outcomes Short Term: Increase workloads from initial exercise prescription for resistance, speed, and METs.;Short Term: Perform resistance training exercises routinely during rehab and add in resistance training at home;Long Term: Improve cardiorespiratory fitness, muscular endurance and strength as measured by increased METs and functional capacity ( ) Short Term: Increase workloads from initial exercise prescription for resistance, speed, and METs.;Short Term:  Perform resistance training exercises routinely during rehab and add in resistance training at home;Long Term: Improve cardiorespiratory fitness, muscular endurance and strength as measured by increased METs and functional capacity ( )      Able to understand and use rate of perceived exertion (RPE) scale Yes Yes      Intervention Provide education and explanation on how to use RPE scale Provide education and explanation on how to use RPE scale      Expected Outcomes Long Term:  Able to use RPE to guide intensity level when exercising independently;Short Term: Able to use RPE daily in rehab to express subjective intensity level Long Term:  Able to use RPE to guide intensity level when exercising independently;Short Term: Able to use RPE daily in rehab to express  subjective intensity level      Able to understand and use Dyspnea scale Yes --      Intervention Provide education and explanation on how to use Dyspnea scale --      Expected Outcomes Short Term: Able to use Dyspnea scale daily in rehab to express subjective sense of shortness of breath during exertion;Long Term: Able to use Dyspnea scale to guide intensity level when exercising independently --      Knowledge and understanding of Target Heart Rate Range (THRR) Yes Yes      Intervention Provide education and explanation of THRR including how the numbers were predicted and where they are located for reference Provide education and explanation of THRR including how the numbers were predicted and where they are located for reference      Expected Outcomes Short Term: Able to state/look up THRR;Short Term: Able to use daily as guideline for intensity in rehab;Long Term: Able to use THRR to govern intensity when exercising independently Short Term: Able to state/look up THRR;Short Term: Able to use daily as guideline for intensity in rehab;Long Term: Able to use THRR to govern intensity when exercising independently      Able to check pulse independently Yes Yes      Intervention Provide education and demonstration on how to check pulse in carotid and radial arteries.;Review the importance of being able to check your own pulse for safety during independent exercise Provide education and demonstration on how to check pulse in carotid and radial arteries.;Review the importance of being able to check your own pulse for safety during independent exercise      Expected Outcomes Short Term: Able to explain why pulse checking is important during independent exercise;Long Term: Able to check pulse independently and accurately Short Term: Able to explain why pulse checking is important during independent exercise;Long Term: Able to check pulse independently and accurately      Understanding of Exercise Prescription  Yes Yes      Intervention Provide education, explanation, and written materials on patient's individual exercise prescription Provide education, explanation, and written materials on patient's individual exercise prescription      Expected Outcomes Short Term: Able to explain program exercise prescription;Long Term: Able to explain home exercise prescription to exercise independently Short Term: Able to explain program exercise prescription;Long Term: Able to explain home exercise prescription to exercise independently               Exercise Goals Re-Evaluation :  Exercise Goals Re-Evaluation     Row Name 10/29/22 0934 11/10/22 0847           Exercise Goal Re-Evaluation   Exercise Goals Review Able to understand and use rate of perceived  exertion (RPE) scale;Able to understand and use Dyspnea scale Increase Physical Activity;Increase Strength and Stamina;Able to understand and use rate of perceived exertion (RPE) scale;Knowledge and understanding of Target Heart Rate Range (THRR);Able to check pulse independently;Understanding of Exercise Prescription      Comments Reviewed RPE  and dyspnea scale, and program prescription with pt today.  Pt voiced understanding and was given a copy of goals to take home. Pt has completed 6 sessions of cardiac rehab. He is very motivated during class and is invcreasing his levels on both sets of equipment. He stated that he goes the the [employer] gym on the days that his is not in class and walks on the treadmill. He is currently exercising at 4.66 METs on the treadmill. Will continue to monitor and progress as able.      Expected Outcomes Short: Use RPE daily to regulate intensity.  Long: Follow program prescription Through exercise at home and rehab, patient will meet their stated goals.                Discharge Exercise Prescription (Final Exercise Prescription Changes):  Exercise Prescription Changes - 11/07/22 0915       Response to Exercise    Blood Pressure (Admit) 126/64    Blood Pressure (Exercise) 134/72    Blood Pressure (Exit) 114/60    Heart Rate (Admit) 84 bpm    Heart Rate (Exercise) 116 bpm    Heart Rate (Exit) 88 bpm    Rating of Perceived Exertion (Exercise) 12    Duration Continue with 30 min of aerobic exercise without signs/symptoms of physical distress.    Intensity THRR unchanged      Progression   Progression Continue to progress workloads to maintain intensity without signs/symptoms of physical distress.      Resistance Training   Training Prescription Yes    Weight 4    Reps 10-15      Treadmill   MPH 3.3    Grade 2.5    Minutes 15    METs 4.66      NuStep   Level 4    Minutes 15    METs 2.9      Oxygen   Maintain Oxygen Saturation 88% or higher             Nutrition:  Target Goals: Understanding of nutrition guidelines, daily intake of sodium 1500mg , cholesterol 200mg , calories 30% from fat and 7% or less from saturated fats, daily to have 5 or more servings of fruits and vegetables.  Biometrics:  Pre Biometrics - 10/27/22 1526       Pre Biometrics   Height 5' 8.2" (1.732 m)    Weight 191 lb 11.2 oz (87 kg)    Waist Circumference 35 inches    Hip Circumference 39 inches    Waist to Hip Ratio 0.9 %    BMI (Calculated) 28.99    Triceps Skinfold 12 mm    Grip Strength 38.9 kg    Single Leg Stand 30 seconds              Nutrition Therapy Plan and Nutrition Goals:  Nutrition Therapy & Goals - 11/03/22 1131       Nutrition Therapy   RD appointment deferred Yes      Personal Nutrition Goals   Comments We provide educational information on heart healthy nutrition with handouts.      Intervention Plan   Intervention Nutrition handout(s) given to patient.    Expected Outcomes Short  Term Goal: Understand basic principles of dietary content, such as calories, fat, sodium, cholesterol and nutrients.             Nutrition Assessments:  Nutrition Assessments -  10/27/22 1351       MEDFICTS Scores   Pre Score 39            MEDIFICTS Score Key: ?70 Need to make dietary changes  40-70 Heart Healthy Diet ? 40 Therapeutic Level Cholesterol Diet   Picture Your Plate Scores: <65 Unhealthy dietary pattern with much room for improvement. 41-50 Dietary pattern unlikely to meet recommendations for good health and room for improvement. 51-60 More healthful dietary pattern, with some room for improvement.  >60 Healthy dietary pattern, although there may be some specific behaviors that could be improved.    Nutrition Goals Re-Evaluation:   Nutrition Goals Discharge (Final Nutrition Goals Re-Evaluation):   Psychosocial: Target Goals: Acknowledge presence or absence of significant depression and/or stress, maximize coping skills, provide positive support system. Participant is able to verbalize types and ability to use techniques and skills needed for reducing stress and depression.  Initial Review & Psychosocial Screening:  Initial Psych Review & Screening - 10/27/22 1412       Initial Review   Current issues with None Identified      Family Dynamics   Good Support System? Yes      Barriers   Psychosocial barriers to participate in program There are no identifiable barriers or psychosocial needs.;The patient should benefit from training in stress management and relaxation.      Screening Interventions   Interventions Encouraged to exercise;To provide support and resources with identified psychosocial needs;Provide feedback about the scores to participant    Expected Outcomes Short Term goal: Utilizing psychosocial counselor, staff and physician to assist with identification of specific Stressors or current issues interfering with healing process. Setting desired goal for each stressor or current issue identified.;Long Term Goal: Stressors or current issues are controlled or eliminated.;Short Term goal: Identification and review with  participant of any Quality of Life or Depression concerns found by scoring the questionnaire.             Quality of Life Scores:  Quality of Life - 10/27/22 1539       Quality of Life   Select Quality of Life      Quality of Life Scores   Health/Function Pre 26.47 %    Socioeconomic Pre 29.64 %    Psych/Spiritual Pre 30 %    Family Pre 27.2 %    GLOBAL Pre 29.94 %            Scores of 19 and below usually indicate a poorer quality of life in these areas.  A difference of  2-3 points is a clinically meaningful difference.  A difference of 2-3 points in the total score of the Quality of Life Index has been associated with significant improvement in overall quality of life, self-image, physical symptoms, and general health in studies assessing change in quality of life.  PHQ-9: Review Flowsheet       10/27/2022  Depression screen PHQ 2/9  Decreased Interest 0  Down, Depressed, Hopeless 0  PHQ - 2 Score 0  Altered sleeping 0  Tired, decreased energy 0  Change in appetite 0  Feeling bad or failure about yourself  0  Trouble concentrating 0  Moving slowly or fidgety/restless 0  Suicidal thoughts 0  PHQ-9 Score 0  Difficult doing work/chores Not difficult  at all    Details           Interpretation of Total Score  Total Score Depression Severity:  1-4 = Minimal depression, 5-9 = Mild depression, 10-14 = Moderate depression, 15-19 = Moderately severe depression, 20-27 = Severe depression   Psychosocial Evaluation and Intervention:  Psychosocial Evaluation - 10/27/22 1412       Psychosocial Evaluation & Interventions   Interventions Stress management education;Relaxation education;Encouraged to exercise with the program and follow exercise prescription    Comments Patient has no psychosocial barriers or issues identified at his orientation visit. His PHQ-9 score was 0. 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 He lives  with his mother. He has a good support system with his mother and 2 adult sons. He is ready to start the program hoping to get back to his normal activities.    Expected Outcomes Patient will continue to have no psychosocial barriers identified.    Continue Psychosocial Services  No Follow up required             Psychosocial Re-Evaluation:  Psychosocial Re-Evaluation     Row Name 11/03/22 1132             Psychosocial Re-Evaluation   Current issues with None Identified       Comments Patient has completed 3 sessions and continues to have no psychosocial barriers identified. He seems to enjoy the sessions and demonstrates an interest in improving his health.       Expected Outcomes Patient will continue to have no psychosocial barriers identified.       Interventions Stress management education;Relaxation education;Encouraged to attend Cardiac Rehabilitation for the exercise       Continue Psychosocial Services  No Follow up required                Psychosocial Discharge (Final Psychosocial Re-Evaluation):  Psychosocial Re-Evaluation - 11/03/22 1132       Psychosocial Re-Evaluation   Current issues with None Identified    Comments Patient has completed 3 sessions and continues to have no psychosocial barriers identified. He seems to enjoy the sessions and demonstrates an interest in improving his health.    Expected Outcomes Patient will continue to have no psychosocial barriers identified.    Interventions Stress management education;Relaxation education;Encouraged to attend Cardiac Rehabilitation for the exercise    Continue Psychosocial Services  No Follow up required             Vocational Rehabilitation: Provide vocational rehab assistance to qualifying candidates.   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 He does not need vocational rehab.             Education: Education Goals: Education classes will be provided on a weekly basis, covering required topics. Participant will state understanding/return demonstration of topics presented.  Learning Barriers/Preferences:  Learning Barriers/Preferences - 10/27/22 1354       Learning Barriers/Preferences   Learning Barriers None    Learning Preferences Written Material;Audio             Education Topics: Hypertension, Hypertension Reduction -Define heart disease and high blood pressure. Discus how high blood pressure affects  the body and ways to reduce high blood pressure. Flowsheet Row CARDIAC REHAB PHASE II EXERCISE from 11/05/2022 in [place] CARDIAC REHABILITATION  Date 10/29/22  Educator DJ  Instruction Review Code 1- Verbalizes Understanding       Exercise and Your Heart -Discuss why it is important to exercise, the FITT principles of exercise, normal and abnormal responses to exercise, and how to exercise safely. Flowsheet Row CARDIAC REHAB PHASE II EXERCISE from 11/05/2022 in [place] CARDIAC REHABILITATION  Date 11/05/22  Educator [facility]  Instruction Review Code 1- Verbalizes Understanding       Angina -Discuss definition of angina, causes of angina, treatment of angina, and how to decrease risk of having angina.   Cardiac Medications -Review what the following cardiac medications are used for, how they affect the body, and side effects that may occur when taking the medications.  Medications include Aspirin, Beta blockers, calcium channel blockers, ACE Inhibitors, angiotensin receptor blockers, diuretics, digoxin, and antihyperlipidemics.   Congestive Heart Failure -Discuss the definition of CHF, how to live with CHF, the signs and symptoms of CHF, and how keep track of weight and sodium intake.   Heart  Disease and Intimacy -Discus the effect sexual activity has on the heart, how changes occur during intimacy as we age, and safety during sexual activity.   Smoking Cessation / COPD -Discuss different methods to quit smoking, the health benefits of quitting smoking, and the definition of COPD.   Nutrition I: Fats -Discuss the types of cholesterol, what cholesterol does to the heart, and how cholesterol levels can be controlled.   Nutrition II: Labels -Discuss the different components of food labels and how to read food label   Heart Parts/Heart Disease and PAD -Discuss the anatomy of the heart, the pathway of blood circulation through the heart, and these are affected by heart disease.   Stress I: Signs and Symptoms -Discuss the causes of stress, how stress may lead to anxiety and depression, and ways to limit stress.   Stress II: Relaxation -Discuss different types of relaxation techniques to limit stress.   Warning Signs of Stroke / TIA -Discuss definition of a stroke, what the signs and symptoms are of a stroke, and how to identify when someone is having stroke.   Knowledge Questionnaire Score:  Knowledge Questionnaire Score - 10/27/22 1354       Knowledge Questionnaire Score   Pre Score 19/24             Core Components/Risk Factors/Patient Goals at Admission:  Personal Goals and Risk Factors at Admission - 10/27/22 1356       Core Components/Risk Factors/Patient Goals on Admission    Weight Management Weight Maintenance    Diabetes Yes    Intervention Provide education about signs/symptoms and action to take for hypo/hyperglycemia.;Provide education about proper nutrition, including hydration, and aerobic/resistive exercise prescription along with prescribed medications to achieve blood glucose in normal ranges: Fasting glucose 65-99 mg/dL    Expected Outcomes Short Term: Participant verbalizes understanding of the signs/symptoms and immediate care of  hyper/hypoglycemia, proper foot care and importance of medication, aerobic/resistive exercise and nutrition plan for blood glucose control.;Long Term: Attainment of HbA1C < 7%.    Hypertension Yes    Intervention Provide education on lifestyle modifcations including regular physical activity/exercise, weight management, moderate sodium restriction and increased consumption of fresh fruit, vegetables, and low fat dairy, alcohol moderation, and smoking cessation.;Monitor prescription use compliance.    Expected Outcomes Long Term: Maintenance of blood pressure  at goal levels.;Short Term: Continued assessment and intervention until BP is < 140/38mm HG in hypertensive participants. < 130/59mm HG in hypertensive participants with diabetes, heart failure or chronic kidney disease.    Lipids Yes    Intervention Provide education and support for participant on nutrition & aerobic/resistive exercise along with prescribed medications to achieve LDL 70mg , HDL >40mg .    Expected Outcomes Short Term: Participant states understanding of desired cholesterol values and is compliant with medications prescribed. Participant is following exercise prescription and nutrition guidelines.;Long Term: Cholesterol controlled with medications as prescribed, with individualized exercise RX and with personalized nutrition plan. Value goals: LDL < 70mg , HDL > 40 mg.    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    Intervention Patient will attend CR 3 days/week with exercise and education.    Expected Outcomes Patient will complete the program meeting both personal and program goals.             Core Components/Risk Factors/Patient Goals Review:   Goals and Risk Factor Review     Row Name 11/03/22 1133             Core Components/Risk Factors/Patient Goals Review   Personal Goals Review Weight  Management/Obesity;Diabetes;Lipids;Hypertension;Other       Review Patient was referred to CR with NSTEMI/Stent placement. He has multiple risk factors for CAD and is participating in the CR for risk modification. He has completed 3 sessions and his current weigth is 191.7 lbs maintained from his intial visit. His blood pressure is at goal. His DM is managed with Metformin. His last A1C was 6.4% 09/15/22. His personal goals for the program are to improve his diet and be able to get back to his normal activities like yard work. We will monitor his progress as he works toward meeting these goals.       Expected Outcomes Patient will complete the program meeting both personal and program goals.                Core Components/Risk Factors/Patient Goals at Discharge (Final Review):   Goals and Risk Factor Review - 11/03/22 1133       Core Components/Risk Factors/Patient Goals Review   Personal Goals Review Weight Management/Obesity;Diabetes;Lipids;Hypertension;Other    Review Patient was referred to CR with NSTEMI/Stent placement. He has multiple risk factors for CAD and is participating in the CR for risk modification. He has completed 3 sessions and his current weigth is 191.7 lbs maintained from his intial visit. His blood pressure is at goal. His DM is managed with Metformin. His last A1C was 6.4% 09/15/22. His personal goals for the program are to improve his diet and be able to get back to his normal activities like yard work. We will monitor his progress as he works toward meeting these goals.    Expected Outcomes Patient will complete the program meeting both personal and program goals.             ITP Comments:  ITP Comments     Row Name 10/29/22 0933 11/12/22 0830         ITP Comments First full day of exercise!  Patient was oriented to gym and equipment including functions, settings, policies, and procedures. 30 day review completed. ITP sent to Dr. Dina Rich, Medical  Director of Cardiac Rehab. Continue with ITP unless changes are made by  physician.   Newer to program.               Comments: 30 day review

## 2022-11-12 NOTE — Telephone Encounter (Signed)
Caller stated patient wants to know if he can be evaluated sooner to return to work as his benefit runs out on 8/11.  Caller stated can call patient directly at [phone].

## 2022-11-12 NOTE — Progress Notes (Signed)
Daily Session Note  Patient Details  Name: Stephen [name] MRN: [id number] Date of Birth: [birth year] Referring Provider:   Flowsheet Row CARDIAC REHAB PHASE II ORIENTATION from 10/27/2022 in [facility] CARDIAC REHABILITATION  Referring Provider Lennie Odor, MD       Encounter Date: 11/12/2022  Check In:  Session Check In - 11/12/22 0930       Check-In   Supervising physician immediately available to respond to emergencies [facility] MD immediately available    Physician(s) Dr. Jenene Slicker    Location [facility]    Staff Present Ross Ludwig, BS, Exercise Physiologist;Sorina Derrig Daphine Deutscher, RN, BSN    Virtual Visit No    Medication changes reported     No    Fall or balance concerns reported    No    Tobacco Cessation No Change    Warm-up and Cool-down Performed on first and last piece of equipment    Resistance Training Performed Yes    VAD Patient? No      Pain Assessment   Currently in Pain? No/denies             Capillary Blood Glucose: No results found for this or any previous visit (from the past 24 hour(s)).    Social History   Tobacco Use  Smoking Status Former  Smokeless Tobacco Never    Goals Met:  Independence with exercise equipment Exercise tolerated well No report of concerns or symptoms today Strength training completed today  Goals Unmet:  Not Applicable  Comments: Pt able to follow exercise prescription today without complaint.  Will continue to monitor for progression.    Dr. Dina Rich is Medical Director for [facility] Cardiac Rehab

## 2022-11-12 NOTE — Telephone Encounter (Signed)
Patient states he has 90 days to be out of work.  His date for 90 days is up August 11.  His paperwork to [employer] is to be out of work until August 30th.  He is to have his F/U appt on August 28, so he can return on that date.  He now needs to move up his appt. To return by the 11th. Patient schedule with PA for the 6th of August to ensure time to complete any paperwork to return to work.  Advised appt date, time, location, and PA .  He states understanding

## 2022-11-14 ENCOUNTER — Encounter (HOSPITAL_COMMUNITY): Payer: 59

## 2022-11-17 ENCOUNTER — Encounter (HOSPITAL_COMMUNITY)
Admission: RE | Admit: 2022-11-17 | Discharge: 2022-11-17 | Disposition: A | Discharge status: Home / Self Care | Payer: 59 | Source: Ambulatory Visit | Attending: Thoracic Surgery (Cardiothoracic Vascular Surgery) | Admitting: Thoracic Surgery (Cardiothoracic Vascular Surgery)

## 2022-11-17 DIAGNOSIS — Z955 Presence of coronary angioplasty implant and graft: Secondary | ICD-10-CM

## 2022-11-17 DIAGNOSIS — I214 Non-ST elevation (NSTEMI) myocardial infarction: Secondary | ICD-10-CM | POA: Diagnosis not present

## 2022-11-17 NOTE — Progress Notes (Signed)
Daily Session Note  Patient Details  Name: Stephen [name] MRN: [id number] Date of Birth: August 23, 1955 Referring Provider:   Flowsheet Row CARDIAC REHAB PHASE II ORIENTATION from 10/27/2022 in [facility] CARDIAC REHABILITATION  Referring Provider Lennie Odor, MD       Encounter Date: 11/17/2022  Check In:  Session Check In - 11/17/22 0930       Check-In   Supervising physician immediately available to respond to emergencies See telemetry face sheet for immediately available MD    Location [facility]    Staff Present Ross Ludwig, BS, Exercise Physiologist;Jessica Juanetta Gosling, MA, RCEP, CCRP, Harolyn Rutherford, RN, BSN    Virtual Visit No    Medication changes reported     No    Fall or balance concerns reported    No    Tobacco Cessation No Change    Warm-up and Cool-down Performed on first and last piece of equipment    Resistance Training Performed Yes    VAD Patient? No      Pain Assessment   Currently in Pain? No/denies             Capillary Blood Glucose: No results found for this or any previous visit (from the past 24 hour(s)).    Social History   Tobacco Use  Smoking Status Former  Smokeless Tobacco Never    Goals Met:  Independence with exercise equipment Exercise tolerated well No report of concerns or symptoms today Strength training completed today  Goals Unmet:  Not Applicable  Comments: Pt able to follow exercise prescription today without complaint.  Will continue to monitor for progression.    Dr. Dina Rich is Medical Director for [facility] Cardiac Rehab

## 2022-11-19 ENCOUNTER — Encounter (HOSPITAL_COMMUNITY)
Admission: RE | Admit: 2022-11-19 | Discharge: 2022-11-19 | Disposition: A | Discharge status: Home / Self Care | Payer: 59 | Source: Ambulatory Visit | Attending: Thoracic Surgery (Cardiothoracic Vascular Surgery) | Admitting: Thoracic Surgery (Cardiothoracic Vascular Surgery)

## 2022-11-19 DIAGNOSIS — I214 Non-ST elevation (NSTEMI) myocardial infarction: Secondary | ICD-10-CM

## 2022-11-19 DIAGNOSIS — Z955 Presence of coronary angioplasty implant and graft: Secondary | ICD-10-CM

## 2022-11-19 NOTE — Progress Notes (Signed)
Daily Session Note  Patient Details  Name: ARBEN PACKMAN MRN: [id number] Date of Birth: 1955/07/09 Referring Provider:   Flowsheet Row CARDIAC REHAB PHASE II ORIENTATION from 10/27/2022 in [facility] CARDIAC REHABILITATION  Referring Provider Lennie Odor, MD       Encounter Date: 11/19/2022  Check In:  Session Check In - 11/19/22 0930       Check-In   Supervising physician immediately available to respond to emergencies See telemetry face sheet for immediately available MD    Physician(s) Dr. Wyline Mood    Location [facility]    Staff Present Ross Ludwig, BS, Exercise Physiologist;Hillary Troutman BSN, RN    Virtual Visit No    Medication changes reported     No    Fall or balance concerns reported    No    Tobacco Cessation No Change    Warm-up and Cool-down Performed on first and last piece of equipment    Resistance Training Performed Yes    VAD Patient? No    PAD/SET Patient? No      Pain Assessment   Currently in Pain? No/denies    Multiple Pain Sites No             Capillary Blood Glucose: No results found for this or any previous visit (from the past 24 hour(s)).    Social History   Tobacco Use  Smoking Status Former  Smokeless Tobacco Never    Goals Met:  Independence with exercise equipment Exercise tolerated well No report of concerns or symptoms today Strength training completed today  Goals Unmet:  Not Applicable  Comments: Pt able to follow exercise prescription today without complaint.  Will continue to monitor for progression.    Dr. Dina Rich is Medical Director for [facility] Cardiac Rehab

## 2022-11-21 ENCOUNTER — Encounter (HOSPITAL_COMMUNITY)
Admission: RE | Admit: 2022-11-21 | Discharge: 2022-11-21 | Disposition: A | Discharge status: Home / Self Care | Payer: 59 | Source: Ambulatory Visit | Attending: Thoracic Surgery (Cardiothoracic Vascular Surgery) | Admitting: Thoracic Surgery (Cardiothoracic Vascular Surgery)

## 2022-11-21 DIAGNOSIS — I214 Non-ST elevation (NSTEMI) myocardial infarction: Secondary | ICD-10-CM

## 2022-11-21 DIAGNOSIS — Z955 Presence of coronary angioplasty implant and graft: Secondary | ICD-10-CM

## 2022-11-21 NOTE — Progress Notes (Signed)
Reviewed home exercise with pt today.  Pt plans to walking and use stationary bike at home for exercise.  Reviewed THR, pulse, RPE, sign and symptoms, pulse oximetery and when to call 911 or MD.  Also discussed weather considerations and indoor options.  Pt voiced understanding.

## 2022-11-21 NOTE — Progress Notes (Signed)
Daily Session Note  Patient Details  Name: ELO ROULAND MRN: [id number] Date of Birth: [birth year] Referring Provider:   Flowsheet Row CARDIAC REHAB PHASE II ORIENTATION from 10/27/2022 in [facility] CARDIAC REHABILITATION  Referring Provider Lennie Odor, MD       Encounter Date: 11/21/2022  Check In:  Session Check In - 11/21/22 0930       Check-In   Supervising physician immediately available to respond to emergencies See telemetry face sheet for immediately available MD    Location [facility]    Staff Present Ross Ludwig, BS, Exercise Physiologist;Debra Laural Benes, RN, Pleas Koch, RN, BSN;Jessica Hawkins, MA, RCEP, CCRP, CCET    Virtual Visit No    Medication changes reported     No    Tobacco Cessation No Change    Warm-up and Cool-down Performed on first and last piece of equipment    Resistance Training Performed Yes      Pain Assessment   Currently in Pain? No/denies             Capillary Blood Glucose: No results found for this or any previous visit (from the past 24 hour(s)).    Social History   Tobacco Use  Smoking Status Former  Smokeless Tobacco Never    Goals Met:  Independence with exercise equipment Exercise tolerated well No report of concerns or symptoms today Strength training completed today  Goals Unmet:  Not Applicable  Comments: Pt able to follow exercise prescription today without complaint.  Will continue to monitor for progression.    Dr. Dina Rich is Medical Director for [facility] Cardiac Rehab

## 2022-11-24 ENCOUNTER — Encounter (HOSPITAL_COMMUNITY): Admission: RE | Admit: 2022-11-24 | Payer: 59 | Source: Ambulatory Visit

## 2022-11-24 DIAGNOSIS — Z955 Presence of coronary angioplasty implant and graft: Secondary | ICD-10-CM

## 2022-11-24 DIAGNOSIS — I214 Non-ST elevation (NSTEMI) myocardial infarction: Secondary | ICD-10-CM

## 2022-11-24 NOTE — Progress Notes (Signed)
Daily Session Note  Patient Details  Name: SHILOH FERSON MRN: [id number] Date of Birth: 1955-09-18 Referring Provider:   Flowsheet Row CARDIAC REHAB PHASE II ORIENTATION from 10/27/2022 in [facility] CARDIAC REHABILITATION  Referring Provider Lennie Odor, MD       Encounter Date: 11/24/2022  Check In:  Session Check In - 11/24/22 2130       Check-In   Supervising physician immediately available to respond to emergencies See telemetry face sheet for immediately available MD    Location [facility]    Staff Present Ross Ludwig, BS, Exercise Physiologist;Maebry Obrien Daphine Deutscher, RN, BSN    Virtual Visit No    Medication changes reported     No    Fall or balance concerns reported    No    Tobacco Cessation No Change    Warm-up and Cool-down Performed on first and last piece of equipment    VAD Patient? No      Pain Assessment   Currently in Pain? No/denies             Capillary Blood Glucose: No results found for this or any previous visit (from the past 24 hour(s)).    Social History   Tobacco Use  Smoking Status Former  Smokeless Tobacco Never    Goals Met:  Independence with exercise equipment Exercise tolerated well No report of concerns or symptoms today Strength training completed today  Goals Unmet:  Not Applicable  Comments: Pt able to follow exercise prescription today without complaint.  Will continue to monitor for progression.    Dr. Dina Rich is Medical Director for [facility] Cardiac Rehab

## 2022-11-26 ENCOUNTER — Encounter (HOSPITAL_COMMUNITY): Admission: RE | Admit: 2022-11-26 | Payer: 59 | Source: Ambulatory Visit

## 2022-11-26 DIAGNOSIS — I214 Non-ST elevation (NSTEMI) myocardial infarction: Secondary | ICD-10-CM | POA: Diagnosis not present

## 2022-11-26 DIAGNOSIS — Z955 Presence of coronary angioplasty implant and graft: Secondary | ICD-10-CM

## 2022-11-26 NOTE — Progress Notes (Signed)
Daily Session Note  Patient Details  Name: BLAYZ OCLAIR MRN: [id number] Date of Birth: 16-Oct-1955 Referring Provider:   Flowsheet Row CARDIAC REHAB PHASE II ORIENTATION from 10/27/2022 in [facility] CARDIAC REHABILITATION  Referring Provider Lennie Odor, MD       Encounter Date: 11/26/2022  Check In:  Session Check In - 11/26/22 0930       Check-In   Supervising physician immediately available to respond to emergencies See telemetry face sheet for immediately available MD    Location [facility]    Staff Present Ross Ludwig, BS, Exercise Physiologist;Jessica Juanetta Gosling, MA, RCEP, CCRP, CCET;Hillary [employer], RN;Banyan Goodchild [place], RN, BSN    Virtual Visit No    Medication changes reported     No    Fall or balance concerns reported    No    Tobacco Cessation No Change    Warm-up and Cool-down Performed on first and last piece of equipment    Resistance Training Performed Yes    VAD Patient? No      Pain Assessment   Currently in Pain? No/denies             Capillary Blood Glucose: No results found for this or any previous visit (from the past 24 hour(s)).    Social History   Tobacco Use  Smoking Status Former  Smokeless Tobacco Never    Goals Met:  Independence with exercise equipment Exercise tolerated well No report of concerns or symptoms today Strength training completed today  Goals Unmet:  Not Applicable  Comments: Pt able to follow exercise prescription today without complaint.  Will continue to monitor for progression.    Dr. Dina Rich is Medical Director for [facility] Cardiac Rehab

## 2022-11-27 NOTE — Addendum Note (Signed)
Encounter addended by: Hazle Nordmann on: 11/27/2022 10:13 AM  Actions taken: Visit diagnoses modified, Flowsheet data copied forward, Flowsheet accepted, Clinical Note Signed, Charge Capture section accepted

## 2022-11-27 NOTE — Addendum Note (Signed)
Encounter addended by: Hazle Nordmann on: 11/27/2022 10:16 AM  Actions taken: Charge Capture section accepted

## 2022-11-27 NOTE — Progress Notes (Signed)
Daily Session Note  Patient Details  Name: SHRI ENFINGER MRN: [id number] Date of Birth: 02-06-1956 Referring Provider:   Flowsheet Row CARDIAC REHAB PHASE II ORIENTATION from 10/27/2022 in [facility] CARDIAC REHABILITATION  Referring Provider Lennie Odor, MD       Encounter Date: 10/31/2022  Check In:   Capillary Blood Glucose: No results found for this or any previous visit (from the past 24 hour(s)).    Social History   Tobacco Use  Smoking Status Former  Smokeless Tobacco Never    Goals Met:  Independence with exercise equipment Exercise tolerated well No report of concerns or symptoms today Strength training completed today  Goals Unmet:  Not Applicable  Comments: Pt able to follow exercise prescription today without complaint.  Will continue to monitor for progression.    Dr. Dina Rich is Medical Director for [facility] Cardiac Rehab

## 2022-11-28 ENCOUNTER — Encounter (HOSPITAL_COMMUNITY): Payer: 59

## 2022-12-01 ENCOUNTER — Encounter (HOSPITAL_COMMUNITY)
Admission: RE | Admit: 2022-12-01 | Discharge: 2022-12-01 | Disposition: A | Discharge status: Home / Self Care | Payer: 59 | Source: Ambulatory Visit | Attending: Thoracic Surgery (Cardiothoracic Vascular Surgery) | Admitting: Thoracic Surgery (Cardiothoracic Vascular Surgery)

## 2022-12-01 DIAGNOSIS — I214 Non-ST elevation (NSTEMI) myocardial infarction: Secondary | ICD-10-CM | POA: Diagnosis present

## 2022-12-01 DIAGNOSIS — Z955 Presence of coronary angioplasty implant and graft: Secondary | ICD-10-CM | POA: Diagnosis present

## 2022-12-01 NOTE — Progress Notes (Unsigned)
Office Visit    Patient Name: Stephen [name] Date of Encounter: 12/02/2022  PCP:  Georgann Housekeeper, MD   [facility]  Cardiologist:  Reatha Harps, MD  Advanced Practice Provider:  No care team member to display Electrophysiologist:  None   HPI    Stephen [name] is a 67 y.o. male with a past medical history of hypertension, diabetes, recent NSTEMI with cardiac medication admitted 20 February 2 stents placed presents today for hospital follow-up.  Most recently seen in the ED 09/20/2022 for discoloration and swelling of the right wrist at the cath site.  Denied pain.  Did have some transient numbness to his thumb and small finger on the left side which resolved.  No weakness, numbness, or tingling.  No chest pain or shortness of breath.  No fever.  On aspirin and Brilinta as prescribed.  Other issues.  Cath site was evaluated and patient was ultimately discharged.  Arterial Doppler was negative for pseudoaneurysm, AV fistula, normal radial, ulnar, and palmar artery flow.  He saw me in the clinic 09/25/2022, he tells me that he has not had any chest pain but does get hot flashes melanite.  He sits up at the end of the bed and feels like he cannot breathe.  Once the hot flash resolves he can breathe much better.  He states his sleep cycle is off.  He goes to bed around 7 PM and is up intermittently throughout the night.  Gets up around 4 to 5 AM.  He is a truck driver by trade and we discussed filling out FMLA paperwork however, he has his 67th birthday on Sunday and may consider retiring.  We discussed resuming his normal activities but using commonsense and taking lots of breaks.  We discussed enrolling in cardiac rehab  Today, he tells me he has been enrolled in cardiac rehab and doing really well.  He is about 6 weeks in.  No chest pain or shortness of breath.  He is taking Tylenol occasionally for muscular pain.  He went to the gym the other day and tried to use a 30  pound weight and do 30 reps of bicep curls and his pectoralis muscle was tight the next day.  Eventually subsided.  Discussed gradually increasing weight over several weeks.  We plan for a follow-up lipid panel in November.  Last LDL was 76.  We discussed having a goal less than 55 for prevention of future heart attack/stroke.  He does drive a truck short distances (about an hour there and an hour back).  He wants to go back to work for a few weeks starting next Monday and then plans to retire at the end of the month.  I think this is fine since the patient is asymptomatic at this time and feeling well.  Return to work note provided for him today.  Reports no shortness of breath nor dyspnea on exertion. Reports no chest pain, pressure, or tightness. No edema, orthopnea, PND. Reports no palpitations.    Past Medical History    Past Medical History:  Diagnosis Date   Cancer [facility]) 2008   prostate   History of inguinal hernia repair    right   Hyperlipidemia    Hypertension    Past Surgical History:  Procedure Laterality Date   CORONARY/GRAFT ACUTE MI REVASCULARIZATION N/A 09/14/2022   Procedure: Coronary/Graft Acute MI Revascularization;  Surgeon: Tonny Bollman, MD;  Location: [facility] INVASIVE CV LAB;  Service: Cardiovascular;  Laterality: N/A;   HEMORRHOID SURGERY     HERNIA REPAIR     INGUINAL HERNIA REPAIR  03/28/2011   Procedure: HERNIA REPAIR INGUINAL ADULT;  Surgeon: Robyne Askew, MD;  Location: [facility];  Service: General;  Laterality: Right;   LEFT HEART CATH AND CORONARY ANGIOGRAPHY N/A 09/14/2022   Procedure: LEFT HEART CATH AND CORONARY ANGIOGRAPHY;  Surgeon: Tonny Bollman, MD;  Location: [facility] INVASIVE CV LAB;  Service: Cardiovascular;  Laterality: N/A;   PROSTATE SURGERY  02/17/07    Allergies  No Known Allergies   EKGs/Labs/Other Studies Reviewed:   The following studies were reviewed today: Cardiac Studies & Procedures   CARDIAC  CATHETERIZATION  CARDIAC CATHETERIZATION 09/14/2022  Narrative   Prox RCA lesion is 40% stenosed.   1st Mrg lesion is 100% stenosed.   Prox LAD to Mid LAD lesion is 90% stenosed.   A drug-eluting stent was successfully placed using a STENT ONYX FRONTIER 2.5X26.   A drug-eluting stent was successfully placed using a STENT ONYX FRONTIER 3.5X18.   Post intervention, there is a 0% residual stenosis.   Post intervention, there is a 0% residual stenosis.  1.  Severe two-vessel coronary artery disease with total occlusion of the first OM branch of the circumflex and severe stenosis of the proximal to mid LAD, both lesions treated with PCI using a single drug-eluting stent at each lesion site 2.  Patent RCA with mild nonobstructive disease 3.  Normal LVEF by echo assessment with no regional wall motion abnormalities, normal LVEDP at cardiac catheterization  Recommendations: Aggressive medical therapy, DAPT with aspirin and ticagrelor without interruption for 12 months, if no complications arise should be appropriate for hospital discharge tomorrow morning.  Findings Coronary Findings Diagnostic  Dominance: Right  Left Anterior Descending Prox LAD to Mid LAD lesion is 90% stenosed. The lesion is eccentric. The lesion is mildly calcified.  Left Circumflex  First Obtuse Marginal Branch 1st Mrg lesion is 100% stenosed. The lesion is thrombotic.  Right Coronary Artery There is mild diffuse disease throughout the vessel. Dominant vessel, PDA and PLA branches are both patent.  There is mild nonobstructive plaquing throughout the mid and distal RCA without any significant high-grade stenoses. Prox RCA lesion is 40% stenosed.  Intervention  Prox LAD to Mid LAD lesion Stent A drug-eluting stent was successfully placed using a STENT ONYX FRONTIER 3.5X18. Post-Intervention Lesion Assessment The intervention was successful. Pre-interventional TIMI flow is 3. Post-intervention TIMI flow is 3. No  complications occurred at this lesion. There is a 0% residual stenosis post intervention.  1st Mrg lesion Stent A drug-eluting stent was successfully placed using a STENT ONYX FRONTIER 2.5X26. Post-Intervention Lesion Assessment The intervention was successful. Pre-interventional TIMI flow is 0. Post-intervention TIMI flow is 3. No complications occurred at this lesion. There is a 0% residual stenosis post intervention.     ECHOCARDIOGRAM  ECHOCARDIOGRAM COMPLETE 09/14/2022  Narrative ECHOCARDIOGRAM REPORT    Patient Name:   CLEMONS STEPLER Date of Exam: 09/14/2022 Medical Rec #:  [id number]      Height:       68.0 in Accession #:    [id number]     Weight:       193.5 lb Date of Birth:  [birth year]       BSA:          2.015 m Patient Age:    66 years       BP:  125/86 mmHg Patient Gender: M              HR:           76 bpm. Exam Location:  Inpatient  Procedure: Strain Analysis, 2D Echo, Color Doppler and Cardiac Doppler  Indications:    abnormal ecg  History:        Patient has no prior history of Echocardiogram examinations. Signs/Symptoms:Chest Pain; Risk Factors:Diabetes and Hypertension.  Sonographer:    Delcie Roch RDCS Referring Phys: [id number] [employer] S KHAN   Sonographer Comments: Global longitudinal strain was attempted. IMPRESSIONS   1. Left ventricular ejection fraction, by estimation, is 60 to 65%. The left ventricle has normal function. The left ventricle has no regional wall motion abnormalities. There is moderate concentric left ventricular hypertrophy. Left ventricular diastolic parameters are consistent with Grade I diastolic dysfunction (impaired relaxation). 2. Right ventricular systolic function is normal. The right ventricular size is normal. Tricuspid regurgitation signal is inadequate for assessing PA pressure. 3. The mitral valve is normal in structure. No evidence of mitral valve regurgitation. 4. The aortic valve is tricuspid. Aortic  valve regurgitation is not visualized. 5. The inferior vena cava is normal in size with greater than 50% respiratory variability, suggesting right atrial pressure of 3 mmHg.  Conclusion(s)/Recommendation(s): Normal biventricular function without evidence of hemodynamically significant valvular heart disease.  FINDINGS Left Ventricle: Left ventricular ejection fraction, by estimation, is 60 to 65%. The left ventricle has normal function. The left ventricle has no regional wall motion abnormalities. The left ventricular internal cavity size was normal in size. There is moderate concentric left ventricular hypertrophy. Left ventricular diastolic parameters are consistent with Grade I diastolic dysfunction (impaired relaxation).  Right Ventricle: The right ventricular size is normal. Right ventricular systolic function is normal. Tricuspid regurgitation signal is inadequate for assessing PA pressure.  Left Atrium: Left atrial size was normal in size.  Right Atrium: Right atrial size was normal in size.  Pericardium: There is no evidence of pericardial effusion.  Mitral Valve: The mitral valve is normal in structure. No evidence of mitral valve regurgitation.  Tricuspid Valve: Tricuspid valve regurgitation is not demonstrated.  Aortic Valve: The aortic valve is tricuspid. Aortic valve regurgitation is not visualized.  Pulmonic Valve: Pulmonic valve regurgitation is not visualized.  Aorta: The aortic root and ascending aorta are structurally normal, with no evidence of dilitation.  Venous: The inferior vena cava is normal in size with greater than 50% respiratory variability, suggesting right atrial pressure of 3 mmHg.  IAS/Shunts: No atrial level shunt detected by color flow Doppler.   LEFT VENTRICLE PLAX 2D LVIDd:         4.40 cm   Diastology LVIDs:         3.40 cm   LV e' medial:    4.90 cm/s LV PW:         1.50 cm   LV E/e' medial:  14.2 LV IVS:        1.50 cm   LV e' lateral:    7.07 cm/s LVOT diam:     2.10 cm   LV E/e' lateral: 9.8 LV SV:         63 LV SV Index:   31        2D Longitudinal Strain LVOT Area:     3.46 cm  2D Strain GLS Avg:     -14.0 %   RIGHT VENTRICLE RV Basal diam:  2.60 cm RV S prime:  12.80 cm/s TAPSE (M-mode): 1.6 cm  LEFT ATRIUM             Index        RIGHT ATRIUM           Index LA diam:        3.80 cm 1.89 cm/m   RA Area:     10.50 cm LA Vol (A2C):   30.4 ml 15.09 ml/m  RA Volume:   19.90 ml  9.87 ml/m LA Vol (A4C):   28.3 ml 14.04 ml/m LA Biplane Vol: 30.9 ml 15.33 ml/m AORTIC VALVE LVOT Vmax:   110.00 cm/s LVOT Vmean:  65.500 cm/s LVOT VTI:    0.181 m  AORTA Ao Root diam: 3.70 cm Ao Asc diam:  3.00 cm  MITRAL VALVE MV Area (PHT): 3.91 cm    SHUNTS MV Decel Time: 194 msec    Systemic VTI:  0.18 m MV E velocity: 69.50 cm/s  Systemic Diam: 2.10 cm MV A velocity: 91.30 cm/s MV E/A ratio:  0.76  [place] signed by Carolan Clines Signature Date/Time: 09/14/2022/10:35:11 AM    Final              EKG:  EKG is not ordered today.    Recent Labs: 09/14/2022: B Natriuretic Peptide 69.4 09/15/2022: TSH 0.581 09/20/2022: Hemoglobin 14.1; Platelets 219 09/24/2022: BUN 18; Creatinine, Ser 1.11; Potassium 3.9; Sodium 135  Recent Lipid Panel    Component Value Date/Time   CHOL 144 09/14/2022 0650   TRIG 37 09/14/2022 0650   HDL 61 09/14/2022 0650   CHOLHDL 2.4 09/14/2022 0650   VLDL 7 09/14/2022 0650   LDLCALC 76 09/14/2022 0650    Home Medications   Current Meds  Medication Sig   aspirin EC 81 MG tablet Take 1 tablet (81 mg total) by mouth daily. Swallow whole.   atorvastatin (LIPITOR) 80 MG tablet Take 1 tablet (80 mg total) by mouth daily.   clopidogrel (PLAVIX) 75 MG tablet Take 1 tablet (75 mg total) by mouth daily. Take 4 tablets (300 mg) the first day. Then each day after take 1 tablet (75 mg) daily   empagliflozin (JARDIANCE) 10 MG TABS tablet Take 1 tablet (10 mg total) by mouth  daily before breakfast.   esomeprazole (NEXIUM) 20 MG capsule Take 20 mg by mouth daily at 12 noon.   Glucosamine Sulfate 1000 MG CAPS Take 2 capsules by mouth daily.   losartan (COZAAR) 50 MG tablet 1 tablet Orally Once a day for 90 days   metFORMIN (GLUCOPHAGE) 500 MG tablet Take 1 tablet (500 mg total) by mouth daily.   metoprolol succinate (TOPROL XL) 25 MG 24 hr tablet Take 1 tablet (25 mg total) by mouth daily.   nitroGLYCERIN (NITROSTAT) 0.4 MG SL tablet Place 1 tablet (0.4 mg total) under the tongue every 5 (five) minutes x 3 doses as needed for chest pain.   polyethylene glycol powder (MIRALAX) 17 GM/SCOOP powder Take 0.5 Containers by mouth as needed for moderate constipation.     Review of Systems      All other systems reviewed and are otherwise negative except as noted above.  Physical Exam    VS:  BP 134/80   Pulse 78   Ht 5\' 8"  (1.727 m)   Wt 190 lb (86.2 kg)   SpO2 95%   BMI 28.89 kg/m  , BMI Body mass index is 28.89 kg/m.  Wt Readings from Last 3 Encounters:  12/02/22 190 lb (86.2 kg)  10/27/22 191 lb 11.2 oz (87 kg)  09/25/22 192 lb 12.8 oz (87.5 kg)    GEN: Well nourished, well developed, in no acute distress. HEENT: normal. Neck: Supple, no JVD, carotid bruits, or masses. Cardiac: RRR, no murmurs, rubs, or gallops. No clubbing, cyanosis, edema.  Radials/PT 2+ and equal bilaterally.  Respiratory:  Respirations regular and unlabored, clear to auscultation bilaterally. GI: Soft, nontender, nondistended. MS: No deformity or atrophy. Skin: Warm and dry, no rash. Neuro:  Strength and sensation are intact. Psych: Normal affect.  Assessment & Plan    CAD status post PCI to proximal to mid LAD and first OM branch -Continue current medications which include aspirin 81 mg daily, Lipitor 80 mg daily, Jardiance 10 mg daily, metoprolol succinate 25 mg daily, nitro as needed, Brilinta 90 mg twice a day -DAPT for at least 6 months -continue rehab at [facility]  Hypertension -Blood pressure well-controlled today 134/80 -Continue current medication regimen -Continue heart healthy, low-sodium diet  HLD -Continue Lipitor 80 mg daily -Will need an updated lipid panel at his next visit-scheduled for Nov with PCP -New LDL goal will be less than 55  DM type II -Most recent A1c 6.4 -Continue control per primary -The Jardiance has really helped his blood glucose level and I would think it would be lower on recheck       Disposition: Follow up 6 months  with Reatha Harps, MD or APP.  Signed, Sharlene Dory, PA-C 12/02/2022, 10:03 AM [facility]

## 2022-12-01 NOTE — Progress Notes (Signed)
Daily Session Note  Patient Details  Name: Stephen [name] MRN: [id number] Date of Birth: 09-20-1955 Referring Provider:   Flowsheet Row CARDIAC REHAB PHASE II ORIENTATION from 10/27/2022 in [facility] CARDIAC REHABILITATION  Referring Provider Lennie Odor, MD       Encounter Date: 12/01/2022  Check In:   Capillary Blood Glucose: No results found for this or any previous visit (from the past 24 hour(s)).    Social History   Tobacco Use  Smoking Status Former  Smokeless Tobacco Never    Goals Met:  Independence with exercise equipment Exercise tolerated well No report of concerns or symptoms today  Goals Unmet:  Not Applicable  Comments: Pt able to follow exercise prescription today without complaint.  Will continue to monitor for progression.    Dr. Dina Rich is Medical Director for [facility]

## 2022-12-02 ENCOUNTER — Ambulatory Visit: Payer: 59 | Attending: Physician Assistant | Admitting: Physician Assistant

## 2022-12-02 ENCOUNTER — Encounter: Payer: Self-pay | Admitting: *Deleted

## 2022-12-02 ENCOUNTER — Encounter: Payer: Self-pay | Admitting: Physician Assistant

## 2022-12-02 VITALS — BP 134/80 | HR 78 | Ht 68.0 in | Wt 190.0 lb

## 2022-12-02 DIAGNOSIS — Z7984 Long term (current) use of oral hypoglycemic drugs: Secondary | ICD-10-CM

## 2022-12-02 DIAGNOSIS — I10 Essential (primary) hypertension: Secondary | ICD-10-CM | POA: Diagnosis not present

## 2022-12-02 DIAGNOSIS — I2510 Atherosclerotic heart disease of native coronary artery without angina pectoris: Secondary | ICD-10-CM | POA: Diagnosis not present

## 2022-12-02 DIAGNOSIS — I214 Non-ST elevation (NSTEMI) myocardial infarction: Secondary | ICD-10-CM | POA: Diagnosis not present

## 2022-12-02 DIAGNOSIS — E118 Type 2 diabetes mellitus with unspecified complications: Secondary | ICD-10-CM

## 2022-12-02 DIAGNOSIS — E785 Hyperlipidemia, unspecified: Secondary | ICD-10-CM

## 2022-12-02 NOTE — Patient Instructions (Signed)
Medication Instructions:     Your physician recommends that you continue on your current medications as directed. Please refer to the Current Medication list given to you today.  *If you need a refill on your cardiac medications before your next appointment, please call your pharmacy*   Lab Work: NONE ORDERED  TODAY    If you have labs (blood work) drawn today and your tests are completely normal, you will receive your results only by: MyChart Message (if you have MyChart) OR A paper copy in the mail If you have any lab test that is abnormal or we need to change your treatment, we will call you to review the results.   Testing/Procedures:  NONE ORDERED  TODAY    Follow-Up: At [facility], you and your health needs are our priority.  As part of our continuing mission to provide you with exceptional heart care, we have created designated Provider Care Teams.  These Care Teams include your primary Cardiologist (physician) and Advanced Practice Providers (APPs -  Physician Assistants and Nurse Practitioners) who all work together to provide you with the care you need, when you need it.  We recommend signing up for the patient portal called "MyChart".  Sign up information is provided on this After Visit Summary.  MyChart is used to connect with patients for Virtual Visits (Telemedicine).  Patients are able to view lab/test results, encounter notes, upcoming appointments, etc.  Non-urgent messages can be sent to your provider as well.   To learn more about what you can do with MyChart, go to [email or web address].    Your next appointment:   6 month(s)  Provider:   Reatha Harps, MD     Other Instructions

## 2022-12-03 ENCOUNTER — Telehealth: Payer: Self-pay | Admitting: Pharmacist

## 2022-12-03 ENCOUNTER — Encounter (HOSPITAL_COMMUNITY)
Admission: RE | Admit: 2022-12-03 | Discharge: 2022-12-03 | Disposition: A | Discharge status: Home / Self Care | Payer: 59 | Source: Ambulatory Visit | Attending: Thoracic Surgery (Cardiothoracic Vascular Surgery) | Admitting: Thoracic Surgery (Cardiothoracic Vascular Surgery)

## 2022-12-03 VITALS — Ht 68.0 in | Wt 186.9 lb

## 2022-12-03 DIAGNOSIS — I214 Non-ST elevation (NSTEMI) myocardial infarction: Secondary | ICD-10-CM | POA: Diagnosis not present

## 2022-12-03 DIAGNOSIS — Z955 Presence of coronary angioplasty implant and graft: Secondary | ICD-10-CM

## 2022-12-03 NOTE — Patient Instructions (Signed)
Discharge Patient Instructions  Patient Details  Name: Stephen [name] MRN: [id number] Date of Birth: 1956/04/04 Referring Provider:  Tonny Bollman, MD   Number of Visits: 15  Reason for Discharge:  Patient reached a stable level of exercise. Patient independent in their exercise. Patient has met program and personal goals.  Smoking History:  Social History   Tobacco Use  Smoking Status Former  Smokeless Tobacco Never    Diagnosis:  NSTEMI (non-ST elevated myocardial infarction) (HCC)  Status post coronary artery stent placement  Initial Exercise Prescription:  Initial Exercise Prescription - 10/27/22 1400       Date of Initial Exercise RX and Referring Provider   Date 10/27/22    Referring Provider Lennie Odor, MD      Oxygen   Maintain Oxygen Saturation 88% or higher      Treadmill   MPH 3    Grade 1    Minutes 15    METs 3.71      NuStep   Level 3    SPM 80    Minutes 15    METs 3      Prescription Details   Frequency (times per week) 3    Duration Progress to 30 minutes of continuous aerobic without signs/symptoms of physical distress      Intensity   THRR 40-80% of Max Heartrate 101-137    Ratings of Perceived Exertion 11-13    Perceived Dyspnea 0-4      Progression   Progression Continue to progress workloads to maintain intensity without signs/symptoms of physical distress.      Resistance Training   Training Prescription Yes    Weight 4 lb    Reps 10-15             Discharge Exercise Prescription (Final Exercise Prescription Changes):  Exercise Prescription Changes - 11/24/22 1200       Response to Exercise   Blood Pressure (Admit) 124/60    Blood Pressure (Exit) 126/60    Heart Rate (Admit) 67 bpm    Heart Rate (Exercise) 93 bpm    Heart Rate (Exit) 76 bpm    Rating of Perceived Exertion (Exercise) 12    Duration Continue with 30 min of aerobic exercise without signs/symptoms of physical distress.    Intensity THRR  unchanged      Progression   Progression Continue to progress workloads to maintain intensity without signs/symptoms of physical distress.      Resistance Training   Training Prescription Yes    Weight 5    Reps 10-15      Treadmill   MPH 2.5    Grade 0    Minutes 15    METs 2.91      NuStep   Level 4    SPM 85    Minutes 15    METs 2.3      Oxygen   Maintain Oxygen Saturation 88% or higher             Functional Capacity:  6 Minute Walk     Row Name 10/27/22 1425 12/03/22 1013       6 Minute Walk   Phase Initial Discharge    Distance 1618 feet 1828 feet    Distance % Change -- 12.97 %    Distance Feet Change -- 210 ft    Walk Time 6 minutes 6 minutes    # of Rest Breaks 0 0    MPH 3.06 4.13    METS 3.4  3.46    RPE 9 12    VO2 Peak 11.89 14.44    Symptoms No No    Resting HR 65 bpm 65 bpm    Resting BP 110/60 128/72    Resting Oxygen Saturation  98 % --    Exercise Oxygen Saturation  during 6 min walk 98 % --    Max Ex. HR 75 bpm 103 bpm    Max Ex. BP 128/64 136/74    2 Minute Post BP 108/64 --           Nutrition & Weight - Outcomes:  Pre Biometrics - 10/27/22 1526       Pre Biometrics   Height 5' 8.2" (1.732 m)    Weight 87 kg    Waist Circumference 35 inches    Hip Circumference 39 inches    Waist to Hip Ratio 0.9 %    BMI (Calculated) 28.99    Triceps Skinfold 12 mm    Grip Strength 38.9 kg    Single Leg Stand 30 seconds             Post Biometrics - 12/03/22 1013        Post  Biometrics   Height 5\' 8"  (1.727 m)    Weight 84.8 kg    Waist Circumference 35 inches    Hip Circumference 38 inches    Waist to Hip Ratio 0.92 %    BMI (Calculated) 28.42    Grip Strength 53.7 kg    Single Leg Stand 30 seconds            Goals reviewed with patient; copy given to patient.

## 2022-12-03 NOTE — Progress Notes (Signed)
Daily Session Note  Patient Details  Name: Stephen [name] MRN: [id number] Date of Birth: 11-03-1955 Referring Provider:   Flowsheet Row CARDIAC REHAB PHASE II ORIENTATION from 10/27/2022 in [facility] CARDIAC REHABILITATION  Referring Provider Lennie Odor, MD       Encounter Date: 12/03/2022  Check In:  Session Check In - 12/03/22 0915       Check-In   Supervising physician immediately available to respond to emergencies See telemetry face sheet for immediately available MD    Location [facility]    Staff Present Fabio Pierce, MA, RCEP, CCRP, Dow Adolph, RN, Pleas Koch, RN, BSN    Virtual Visit No    Medication changes reported     No    Fall or balance concerns reported    No    Warm-up and Cool-down Performed on first and last piece of equipment    Resistance Training Performed Yes    VAD Patient? No    PAD/SET Patient? No      Pain Assessment   Currently in Pain? No/denies    Multiple Pain Sites No             Capillary Blood Glucose: No results found for this or any previous visit (from the past 24 hour(s)).    Social History   Tobacco Use  Smoking Status Former  Smokeless Tobacco Never    Goals Met:  Independence with exercise equipment Exercise tolerated well No report of concerns or symptoms today Strength training completed today  Goals Unmet:  Not Applicable  Comments: Check out 1030.   Dr. Dina Rich is Medical Director for [facility] Card[facility] Re[facility]

## 2022-12-04 NOTE — Progress Notes (Signed)
Cardiac Individual Treatment Plan  Patient Details  Name: Stephen [name] MRN: [id number] Date of Birth: [birth year] Referring Provider:   Flowsheet Row CARDIAC REHAB PHASE II ORIENTATION from 10/27/2022 in [facility] CARD[facility]AC REH[facility]ILITATION  Referring Provider Lennie Odor, MD       Initial Encounter Date:  Flowsheet Row CARDIAC REHAB PHASE II ORIENTATION from 10/27/2022 in [place] CARDIAC REHABILITATION  Date 10/27/22       Visit Diagnosis: NSTEMI (non-ST elevated myocardial infarction) [facility])  Status post coronary artery stent placement  Patient's Home Medications on Admission:  Current Outpatient Medications:    aspirin EC 81 MG tablet, Take 1 tablet (81 mg total) by mouth daily. Swallow whole., Disp: 120 tablet, Rfl: 2   atorvastatin (LIPITOR) 80 MG tablet, Take 1 tablet (80 mg total) by mouth daily., Disp: 90 tablet, Rfl: 3   clopidogrel (PLAVIX) 75 MG tablet, Take 1 tablet (75 mg total) by mouth daily. Take 4 tablets (300 mg) the first day. Then each day after take 1 tablet (75 mg) daily, Disp: 30 tablet, Rfl: 11   empagliflozin (JARDIANCE) 10 MG TABS tablet, Take 1 tablet (10 mg total) by mouth daily before breakfast., Disp: 30 tablet, Rfl: 11   esomeprazole (NEXIUM) 20 MG capsule, Take 20 mg by mouth daily at 12 noon., Disp: , Rfl:    Glucosamine Sulfate 1000 MG CAPS, Take 2 capsules by mouth daily., Disp: , Rfl:    losartan (COZAAR) 50 MG tablet, 1 tablet Orally Once a day for 90 days, Disp: , Rfl:    metFORMIN (GLUCOPHAGE) 500 MG tablet, Take 1 tablet (500 mg total) by mouth daily., Disp: , Rfl:    metoprolol succinate (TOPROL XL) 25 MG 24 hr tablet, Take 1 tablet (25 mg total) by mouth daily., Disp: 30 tablet, Rfl: 11   nitroGLYCERIN (NITROSTAT) 0.4 MG SL tablet, Place 1 tablet (0.4 mg total) under the tongue every 5 (five) minutes x 3 doses as needed for chest pain., Disp: 25 tablet, Rfl: 1   polyethylene glycol powder (MIRALAX) 17 GM/SCOOP powder, Take 0.5 Containers by  mouth as needed for moderate constipation., Disp: , Rfl:   Past Medical History: Past Medical History:  Diagnosis Date   Cancer (HCC) 2008   prostate   History of inguinal hernia repair    right   Hyperlipidemia    Hypertension     Tobacco Use: Social History   Tobacco Use  Smoking Status Former  Smokeless Tobacco Never    Labs: Review Flowsheet       Latest Ref Rng & Units 09/14/2022 09/15/2022  Labs for ITP Cardiac and Pulmonary Rehab  Cholestrol 0 - 200 mg/dL 657  -  LDL (calc) 0 - 99 mg/dL 76  -  HDL-C >84 mg/dL 61  -  Trlycerides <696 mg/dL 37  -  Hemoglobin E9B 4.8 - 5.6 % - 6.4     Details            Capillary Blood Glucose: Lab Results  Component Value Date   GLUCAP 93 11/03/2022   GLUCAP 164 (H) 11/03/2022   GLUCAP 139 (H) 10/31/2022   GLUCAP 170 (H) 10/31/2022   GLUCAP 115 (H) 10/29/2022     Exercise Target Goals: Exercise Program Goal: Individual exercise prescription set using results from initial 6 min walk test and THRR while considering  patient's activity barriers and safety.   Exercise Prescription Goal: Starting with aerobic activity 30 plus minutes a day, 3 days per week for initial exercise  prescription. Provide home exercise prescription and guidelines that participant acknowledges understanding prior to discharge.  Activity Barriers & Risk Stratification:  Activity Barriers & Cardiac Risk Stratification - 10/27/22 1306       Activity Barriers & Cardiac Risk Stratification   Activity Barriers None    Cardiac Risk Stratification Moderate             6 Minute Walk:  6 Minute Walk     Row Name 10/27/22 1425 12/03/22 1013       6 Minute Walk   Phase Initial Discharge    Distance 1618 feet 1828 feet    Distance % Change -- 12.97 %    Distance Feet Change -- 210 ft    Walk Time 6 minutes 6 minutes    # of Rest Breaks 0 0    MPH 3.06 4.13    METS 3.4 3.46    RPE 9 12    VO2 Peak 11.89 14.44    Symptoms No No     Resting HR 65 bpm 65 bpm    Resting BP 110/60 128/72    Resting Oxygen Saturation  98 % --    Exercise Oxygen Saturation  during 6 min walk 98 % --    Max Ex. HR 75 bpm 103 bpm    Max Ex. BP 128/64 136/74    2 Minute Post BP 108/64 --             Oxygen Initial Assessment:   Oxygen Re-Evaluation:   Oxygen Discharge (Final Oxygen Re-Evaluation):   Initial Exercise Prescription:  Initial Exercise Prescription - 10/27/22 1400       Date of Initial Exercise RX and Referring Provider   Date 10/27/22    Referring Provider Lennie Odor, MD      Oxygen   Maintain Oxygen Saturation 88% or higher      Treadmill   MPH 3    Grade 1    Minutes 15    METs 3.71      NuStep   Level 3    SPM 80    Minutes 15    METs 3      Prescription Details   Frequency (times per week) 3    Duration Progress to 30 minutes of continuous aerobic without signs/symptoms of physical distress      Intensity   THRR 40-80% of Max Heartrate 101-137    Ratings of Perceived Exertion 11-13    Perceived Dyspnea 0-4      Progression   Progression Continue to progress workloads to maintain intensity without signs/symptoms of physical distress.      Resistance Training   Training Prescription Yes    Weight 4 lb    Reps 10-15             Perform Capillary Blood Glucose checks as needed.  Exercise Prescription Changes:   Exercise Prescription Changes     Row Name 10/27/22 1400 11/07/22 0915 11/24/22 1200         Response to Exercise   Blood Pressure (Admit) 110/60 126/64 124/60     Blood Pressure (Exercise) 128/66 134/72 --     Blood Pressure (Exit) 108/64 114/60 126/60     Heart Rate (Admit) 65 bpm 84 bpm 67 bpm     Heart Rate (Exercise) 75 bpm 116 bpm 93 bpm     Heart Rate (Exit) 72 bpm 88 bpm 76 bpm     Oxygen Saturation (Admit) 98 % -- --  Oxygen Saturation (Exercise) 98 % -- --     Rating of Perceived Exertion (Exercise) 9 12 12      Symptoms none -- --     Comments  walk test results -- --     Duration -- Continue with 30 min of aerobic exercise without signs/symptoms of physical distress. Continue with 30 min of aerobic exercise without signs/symptoms of physical distress.     Intensity -- THRR unchanged THRR unchanged       Progression   Progression -- Continue to progress workloads to maintain intensity without signs/symptoms of physical distress. Continue to progress workloads to maintain intensity without signs/symptoms of physical distress.       Resistance Training   Training Prescription -- Yes Yes     Weight -- 4 5     Reps -- 10-15 10-15       Treadmill   MPH -- 3.3 2.5     Grade -- 2.5 0     Minutes -- 15 15     METs -- 4.66 2.91       NuStep   Level -- 4 4     SPM -- -- 85     Minutes -- 15 15     METs -- 2.9 2.3       Oxygen   Maintain Oxygen Saturation -- 88% or higher 88% or higher              Exercise Comments:   Exercise Comments     Row Name 10/29/22 0935 11/07/22 1002 12/04/22 1325       Exercise Comments First full day of exercise!  Patient was oriented to gym and equipment including functions, settings, policies, and procedures. Talked about trying out intervals next week. Bertrand graduated today from  rehab with 15 sessions completed.  Details of the patient's exercise prescription and what he  needs to do in order to continue the prescription and progress were discussed with patient.  Patient was given a copy of prescription and goals.  Patient verbalized understanding. Naeem plans to continue to exercise by walking and biking at home.              Exercise Goals and Review:   Exercise Goals     Row Name 10/27/22 1525 11/10/22 0847           Exercise Goals   Increase Physical Activity Yes Yes      Intervention Provide advice, education, support and counseling about physical activity/exercise needs.;Develop an individualized exercise prescription for aerobic and resistive training based on initial  evaluation findings, risk stratification, comorbidities and participant's personal goals. Provide advice, education, support and counseling about physical activity/exercise needs.;Develop an individualized exercise prescription for aerobic and resistive training based on initial evaluation findings, risk stratification, comorbidities and participant's personal goals.      Expected Outcomes Short Term: Attend rehab on a regular basis to increase amount of physical activity.;Long Term: Exercising regularly at least 3-5 days a week.;Long Term: Add in home exercise to make exercise part of routine and to increase amount of physical activity. Short Term: Attend rehab on a regular basis to increase amount of physical activity.;Long Term: Exercising regularly at least 3-5 days a week.;Long Term: Add in home exercise to make exercise part of routine and to increase amount of physical activity.      Increase Strength and Stamina Yes Yes      Intervention Provide advice, education, support and counseling about physical activity/exercise needs.;Develop an  individualized exercise prescription for aerobic and resistive training based on initial evaluation findings, risk stratification, comorbidities and participant's personal goals. Provide advice, education, support and counseling about physical activity/exercise needs.;Develop an individualized exercise prescription for aerobic and resistive training based on initial evaluation findings, risk stratification, comorbidities and participant's personal goals.      Expected Outcomes Short Term: Increase workloads from initial exercise prescription for resistance, speed, and METs.;Short Term: Perform resistance training exercises routinely during rehab and add in resistance training at home;Long Term: Improve cardiorespiratory fitness, muscular endurance and strength as measured by increased METs and functional capacity ( ) Short Term: Increase workloads from initial exercise  prescription for resistance, speed, and METs.;Short Term: Perform resistance training exercises routinely during rehab and add in resistance training at home;Long Term: Improve cardiorespiratory fitness, muscular endurance and strength as measured by increased METs and functional capacity ( )      Able to understand and use rate of perceived exertion (RPE) scale Yes Yes      Intervention Provide education and explanation on how to use RPE scale Provide education and explanation on how to use RPE scale      Expected Outcomes Long Term:  Able to use RPE to guide intensity level when exercising independently;Short Term: Able to use RPE daily in rehab to express subjective intensity level Long Term:  Able to use RPE to guide intensity level when exercising independently;Short Term: Able to use RPE daily in rehab to express subjective intensity level      Able to understand and use Dyspnea scale Yes --      Intervention Provide education and explanation on how to use Dyspnea scale --      Expected Outcomes Short Term: Able to use Dyspnea scale daily in rehab to express subjective sense of shortness of breath during exertion;Long Term: Able to use Dyspnea scale to guide intensity level when exercising independently --      Knowledge and understanding of Target Heart Rate Range (THRR) Yes Yes      Intervention Provide education and explanation of THRR including how the numbers were predicted and where they are located for reference Provide education and explanation of THRR including how the numbers were predicted and where they are located for reference      Expected Outcomes Short Term: Able to state/look up THRR;Short Term: Able to use daily as guideline for intensity in rehab;Long Term: Able to use THRR to govern intensity when exercising independently Short Term: Able to state/look up THRR;Short Term: Able to use daily as guideline for intensity in rehab;Long Term: Able to use THRR to govern intensity when  exercising independently      Able to check pulse independently Yes Yes      Intervention Provide education and demonstration on how to check pulse in carotid and radial arteries.;Review the importance of being able to check your own pulse for safety during independent exercise Provide education and demonstration on how to check pulse in carotid and radial arteries.;Review the importance of being able to check your own pulse for safety during independent exercise      Expected Outcomes Short Term: Able to explain why pulse checking is important during independent exercise;Long Term: Able to check pulse independently and accurately Short Term: Able to explain why pulse checking is important during independent exercise;Long Term: Able to check pulse independently and accurately      Understanding of Exercise Prescription Yes Yes      Intervention Provide education, explanation, and written materials  on patient's individual exercise prescription Provide education, explanation, and written materials on patient's individual exercise prescription      Expected Outcomes Short Term: Able to explain program exercise prescription;Long Term: Able to explain home exercise prescription to exercise independently Short Term: Able to explain program exercise prescription;Long Term: Able to explain home exercise prescription to exercise independently               Exercise Goals Re-Evaluation :  Exercise Goals Re-Evaluation     Row Name 10/29/22 [phone] 11/10/22 0847 11/21/22 0934 11/25/22 0817       Exercise Goal Re-Evaluation   Exercise Goals Review Able to understand and use rate of perceived exertion (RPE) scale;Able to understand and use Dyspnea scale Increase Physical Activity;Increase Strength and Stamina;Able to understand and use rate of perceived exertion (RPE) scale;Knowledge and understanding of Target Heart Rate Range (THRR);Able to check pulse independently;Understanding of Exercise Prescription  Increase Physical Activity;Increase Strength and Stamina;Understanding of Exercise Prescription;Able to understand and use rate of perceived exertion (RPE) scale;Able to understand and use Dyspnea scale;Knowledge and understanding of Target Heart Rate Range (THRR);Able to check pulse independently --    Comments Reviewed RPE  and dyspnea scale, and program prescription with pt today.  Pt voiced understanding and was given a copy of goals to take home. Pt has completed 6 sessions of cardiac rehab. He is very motivated during class and is invcreasing his levels on both sets of equipment. He stated that he goes the the [employer] [facility] on the days that his is not in class and walks on the treadmill. He is currently exercising at 4.66 METs on the treadmill. Will continue to monitor and progress as able. Gagandeep is doing well in rehab.  He is already starting to feel better and has more stamina. Reviewed home exercise with pt today.  Pt plans to walking and use stationary bike at home for exercise.  Reviewed THR, pulse, RPE, sign and symptoms, pulse oximetery and when to call 911 or MD.  Also discussed weather considerations and indoor options.  Pt voiced understanding.  He has 10 lb weights at home and will start to add in more in class Pt has increase his speed slightly on the treadmill and his level on the new step by 1    Expected Outcomes Short: Use RPE daily to regulate intensity.  Long: Follow program prescription Through exercise at home and rehab, patient will meet their stated goals. Short: continue to add in exercise at home Long: Continue to improve strength --              Discharge Exercise Prescription (Final Exercise Prescription Changes):  Exercise Prescription Changes - 11/24/22 1200       Response to Exercise   Blood Pressure (Admit) 124/60    Blood Pressure (Exit) 126/60    Heart Rate (Admit) 67 bpm    Heart Rate (Exercise) 93 bpm    Heart Rate (Exit) 76 bpm    Rating of Perceived Exertion  (Exercise) 12    Duration Continue with 30 min of aerobic exercise without signs/symptoms of physical distress.    Intensity THRR unchanged      Progression   Progression Continue to progress workloads to maintain intensity without signs/symptoms of physical distress.      Resistance Training   Training Prescription Yes    Weight 5    Reps 10-15      Treadmill   MPH 2.5    Grade 0  Minutes 15    METs 2.91      NuStep   Level 4    SPM 85    Minutes 15    METs 2.3      Oxygen   Maintain Oxygen Saturation 88% or higher             Nutrition:  Target Goals: Understanding of nutrition guidelines, daily intake of sodium 1500mg , cholesterol 200mg , calories 30% from fat and 7% or less from saturated fats, daily to have 5 or more servings of fruits and vegetables.  Biometrics:  Pre Biometrics - 10/27/22 1526       Pre Biometrics   Height 5' 8.2" (1.732 m)    Weight 87 kg    Waist Circumference 35 inches    Hip Circumference 39 inches    Waist to Hip Ratio 0.9 %    BMI (Calculated) 28.99    Triceps Skinfold 12 mm    Grip Strength 38.9 kg    Single Leg Stand 30 seconds             Post Biometrics - 12/03/22 1013        Post  Biometrics   Height 5\' 8"  (1.727 m)    Weight 84.8 kg    Waist Circumference 35 inches    Hip Circumference 38 inches    Waist to Hip Ratio 0.92 %    BMI (Calculated) 28.42    Grip Strength 53.7 kg    Single Leg Stand 30 seconds             Nutrition Therapy Plan and Nutrition Goals:  Nutrition Therapy & Goals - 11/03/22 1131       Nutrition Therapy   RD appointment deferred Yes      Personal Nutrition Goals   Comments We provide educational information on heart healthy nutrition with handouts.      Intervention Plan   Intervention Nutrition handout(s) given to patient.    Expected Outcomes Short Term Goal: Understand basic principles of dietary content, such as calories, fat, sodium, cholesterol and nutrients.              Nutrition Assessments:  Nutrition Assessments - 12/04/22 1254       MEDFICTS Scores   Pre Score 39    Post Score 51    Score Difference 12            MEDIFICTS Score Key: ?70 Need to make dietary changes  40-70 Heart Healthy Diet ? 40 Therapeutic Level Cholesterol Diet   Picture Your Plate Scores: <16 Unhealthy dietary pattern with much room for improvement. 41-50 Dietary pattern unlikely to meet recommendations for good health and room for improvement. 51-60 More healthful dietary pattern, with some room for improvement.  >60 Healthy dietary pattern, although there may be some specific behaviors that could be improved.    Nutrition Goals Re-Evaluation:  Nutrition Goals Re-Evaluation     Row Name 11/21/22 0945             Goals   Nutrition Goal Heart Healthy Diet       Comment Ayon is doing well in rehab. He is finding his diet to be challenging.  He is working on learning what to eat and what not to eat.  He is staying away from sugar and salt.  He is also trying to avoid fried food.  He has not tried salamon yet but willing to try.  He is eating baked chicken and switched to  green tea in bottle.  He is trying to add in more water. He has also switched to almonds for a snacks.  He had cheeiros with a banana and almonds for breakfast. He is trying to find right things.  We talked about making sure to balance out any carbs with protein and fats.       Expected Outcome Short: Continue to try new recipes Long: Continue to add in more fruit and vegetables                Nutrition Goals Discharge (Final Nutrition Goals Re-Evaluation):  Nutrition Goals Re-Evaluation - 11/21/22 0945       Goals   Nutrition Goal Heart Healthy Diet    Comment Nathon is doing well in rehab. He is finding his diet to be challenging.  He is working on learning what to eat and what not to eat.  He is staying away from sugar and salt.  He is also trying to avoid fried food.  He  has not tried salamon yet but willing to try.  He is eating baked chicken and switched to green tea in bottle.  He is trying to add in more water. He has also switched to almonds for a snacks.  He had cheeiros with a banana and almonds for breakfast. He is trying to find right things.  We talked about making sure to balance out any carbs with protein and fats.    Expected Outcome Short: Continue to try new recipes Long: Continue to add in more fruit and vegetables             Psychosocial: Target Goals: Acknowledge presence or absence of significant depression and/or stress, maximize coping skills, provide positive support system. Participant is able to verbalize types and ability to use techniques and skills needed for reducing stress and depression.  Initial Review & Psychosocial Screening:  Initial Psych Review & Screening - 10/27/22 1412       Initial Review   Current issues with None Identified      Family Dynamics   Good Support System? Yes      Barriers   Psychosocial barriers to participate in program There are no identifiable barriers or psychosocial needs.;The patient should benefit from training in stress management and relaxation.      Screening Interventions   Interventions Encouraged to exercise;To provide support and resources with identified psychosocial needs;Provide feedback about the scores to participant    Expected Outcomes Short Term goal: Utilizing psychosocial counselor, staff and physician to assist with identification of specific Stressors or current issues interfering with healing process. Setting desired goal for each stressor or current issue identified.;Long Term Goal: Stressors or current issues are controlled or eliminated.;Short Term goal: Identification and review with participant of any Quality of Life or Depression concerns found by scoring the questionnaire.             Quality of Life Scores:  Quality of Life - 12/04/22 1253       Quality  of Life   Select Quality of Life      Quality of Life Scores   Health/Function Pre 26.47 %    Health/Function Post 24 %    Health/Function % Change -9.33 %    Socioeconomic Pre 29.64 %    Socioeconomic Post 24 %    Socioeconomic % Change  -19.03 %    Psych/Spiritual Pre 30 %    Psych/Spiritual Post 27.43 %    Psych/Spiritual % Change -8.57 %  Family Pre 27.2 %    Family Post 24 %    Family % Change -11.76 %    GLOBAL Pre 29.94 %    GLOBAL Post 24.71 %    GLOBAL % Change -17.47 %            Scores of 19 and below usually indicate a poorer quality of life in these areas.  A difference of  2-3 points is a clinically meaningful difference.  A difference of 2-3 points in the total score of the Quality of Life Index has been associated with significant improvement in overall quality of life, self-image, physical symptoms, and general health in studies assessing change in quality of life.  PHQ-9: Review Flowsheet       12/04/2022 10/27/2022  Depression screen PHQ 2/9  Decreased Interest 0 0  Down, Depressed, Hopeless 0 0  PHQ - 2 Score 0 0  Altered sleeping 0 0  Tired, decreased energy 0 0  Change in appetite 0 0  Feeling bad or failure about yourself  0 0  Trouble concentrating 0 0  Moving slowly or fidgety/restless 0 0  Suicidal thoughts 0 0  PHQ-9 Score 0 0  Difficult doing work/chores Not difficult at all Not difficult at all    Details           Interpretation of Total Score  Total Score Depression Severity:  1-4 = Minimal depression, 5-9 = Mild depression, 10-14 = Moderate depression, 15-19 = Moderately severe depression, 20-27 = Severe depression   Psychosocial Evaluation and Intervention:  Psychosocial Evaluation - 10/27/22 1412       Psychosocial Evaluation & Interventions   Interventions Stress management education;Relaxation education;Encouraged to exercise with the program and follow exercise prescription    Comments Patient has no psychosocial  barriers or issues identified at his orientation visit. His PHQ-9 score was 0. 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 He lives with his mother. He has a good support system with his mother and 2 adult sons. He is ready to start the program hoping to get back to his normal activities.    Expected Outcomes Patient will continue to have no psychosocial barriers identified.    Continue Psychosocial Services  No Follow up required             Psychosocial Re-Evaluation:  Psychosocial Re-Evaluation     Row Name 11/03/22 1132 11/21/22 0936           Psychosocial Re-Evaluation   Current issues with None Identified Current Sleep Concerns      Comments Patient has completed 3 sessions and continues to have no psychosocial barriers identified. He seems to enjoy the sessions and demonstrates an interest in improving his health. Miley is doing well in rehab.  He denies any major stressors as he tries not to let anything get to him too much.  He is sleeping pretty well for most part.  He usually gets his 6 hrs a night.  He has occassions where he does wake at night and sometimes has a harder time getting back to sleep and will use meditation, prayer, and reading to calm his mind and rest.      Expected Outcomes Patient will continue to have no psychosocial barriers identified. Short: Continue to use techniques to help with sleeping Long: Continue to stay positive      Interventions Stress management education;Relaxation education;Encouraged to attend Cardiac Rehabilitation for the exercise Stress  management education;Encouraged to attend Cardiac Rehabilitation for the exercise      Continue Psychosocial Services  No Follow up required Follow up required by staff               Psychosocial Discharge (Final Psychosocial Re-Evaluation):  Psychosocial Re-Evaluation - 11/21/22 0936       Psychosocial Re-Evaluation   Current issues with Current Sleep  Concerns    Comments Admir is doing well in rehab.  He denies any major stressors as he tries not to let anything get to him too much.  He is sleeping pretty well for most part.  He usually gets his 6 hrs a night.  He has occassions where he does wake at night and sometimes has a harder time getting back to sleep and will use meditation, prayer, and reading to calm his mind and rest.    Expected Outcomes Short: Continue to use techniques to help with sleeping Long: Continue to stay positive    Interventions Stress management education;Encouraged to attend Cardiac Rehabilitation for the exercise    Continue Psychosocial Services  Follow up required by staff             Vocational Rehabilitation: Provide vocational rehab assistance to qualifying candidates.   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 He does not need vocational rehab.             Education: Education Goals: Education classes will be provided on a weekly basis, covering required topics. Participant will state understanding/return demonstration of topics presented.  Learning Barriers/Preferences:  Learning Barriers/Preferences - 10/27/22 1354       Learning Barriers/Preferences   Learning Barriers None    Learning Preferences Written Material;Audio             Education Topics: Hypertension, Hypertension Reduction -Define heart disease and high blood pressure. Discus how high blood pressure affects the body and ways to reduce high blood pressure. Flowsheet Row CARDIAC REHAB PHASE II EXERCISE from 12/03/2022 in [place] CARDIAC REHABILITATION  Date 10/29/22  Educator DJ  Instruction Review Code 1- Verbalizes Understanding       Exercise and Your  Heart -Discuss why it is important to exercise, the FITT principles of exercise, normal and abnormal responses to exercise, and how to exercise safely. Flowsheet Row CARDIAC REHAB PHASE II EXERCISE from 12/03/2022 in [place] CARDIAC REHABILITATION  Date 11/05/22  Educator [facility]  Instruction Review Code 1- Verbalizes Understanding       Angina -Discuss definition of angina, causes of angina, treatment of angina, and how to decrease risk of having angina. Flowsheet Row CARDIAC REHAB PHASE II EXERCISE from 12/03/2022 in [place] CARDIAC REHABILITATION  Date 11/12/22  Educator jh  Instruction Review Code 1- Verbalizes Understanding       Cardiac Medications -Review what the following cardiac medications are used for, how they affect the body, and side effects that may occur when taking the medications.  Medications include Aspirin, Beta blockers, calcium channel blockers, ACE Inhibitors, angiotensin receptor blockers, diuretics, digoxin, and antihyperlipidemics. Flowsheet Row CARDIAC REHAB PHASE II EXERCISE from 12/03/2022 in [place] CARDIAC REHABILITATION  Date 11/19/22  Educator HB  Instruction Review Code 1- Verbalizes Understanding  Congestive Heart Failure -Discuss the definition of CHF, how to live with CHF, the signs and symptoms of CHF, and how keep track of weight and sodium intake. Flowsheet Row CARDIAC REHAB PHASE II EXERCISE from 12/03/2022 in [place] CARDIAC REHABILITATION  Date 11/26/22  Educator HB  Instruction Review Code 1- Verbalizes Understanding       Heart Disease and Intimacy -Discus the effect sexual activity has on the heart, how changes occur during intimacy as we age, and safety during sexual activity. Flowsheet Row CARDIAC REHAB PHASE II EXERCISE from 12/03/2022 in [place] CARDIAC REHABILITATION  Date 12/03/22  Educator [facility]  Instruction Review Code 1- Verbalizes Understanding       Smoking Cessation / COPD -Discuss different methods to  quit smoking, the health benefits of quitting smoking, and the definition of COPD.   Nutrition I: Fats -Discuss the types of cholesterol, what cholesterol does to the heart, and how cholesterol levels can be controlled.   Nutrition II: Labels -Discuss the different components of food labels and how to read food label   Heart Parts/Heart Disease and PAD -Discuss the anatomy of the heart, the pathway of blood circulation through the heart, and these are affected by heart disease.   Stress I: Signs and Symptoms -Discuss the causes of stress, how stress may lead to anxiety and depression, and ways to limit stress.   Stress II: Relaxation -Discuss different types of relaxation techniques to limit stress.   Warning Signs of Stroke / TIA -Discuss definition of a stroke, what the signs and symptoms are of a stroke, and how to identify when someone is having stroke.   Knowledge Questionnaire Score:  Knowledge Questionnaire Score - 12/04/22 1254       Knowledge Questionnaire Score   Pre Score 19/24    Post Score 18/24             Core Components/Risk Factors/Patient Goals at Admission:  Personal Goals and Risk Factors at Admission - 10/27/22 1356       Core Components/Risk Factors/Patient Goals on Admission    Weight Management Weight Maintenance    Diabetes Yes    Intervention Provide education about signs/symptoms and action to take for hypo/hyperglycemia.;Provide education about proper nutrition, including hydration, and aerobic/resistive exercise prescription along with prescribed medications to achieve blood glucose in normal ranges: Fasting glucose 65-99 mg/dL    Expected Outcomes Short Term: Participant verbalizes understanding of the signs/symptoms and immediate care of hyper/hypoglycemia, proper foot care and importance of medication, aerobic/resistive exercise and nutrition plan for blood glucose control.;Long Term: Attainment of HbA1C < 7%.    Hypertension Yes     Intervention Provide education on lifestyle modifcations including regular physical activity/exercise, weight management, moderate sodium restriction and increased consumption of fresh fruit, vegetables, and low fat dairy, alcohol moderation, and smoking cessation.;Monitor prescription use compliance.    Expected Outcomes Long Term: Maintenance of blood pressure at goal levels.;Short Term: Continued assessment and intervention until BP is < 140/35mm HG in hypertensive participants. < 130/91mm HG in hypertensive participants with diabetes, heart failure or chronic kidney disease.    Lipids Yes    Intervention Provide education and support for participant on nutrition & aerobic/resistive exercise along with prescribed medications to achieve LDL 70mg , HDL >40mg .    Expected Outcomes Short Term: Participant states understanding of desired cholesterol values and is compliant with medications prescribed. Participant is following exercise prescription and nutrition guidelines.;Long Term: Cholesterol controlled with medications as prescribed, with individualized exercise RX and  with personalized nutrition plan. Value goals: LDL < 70mg , HDL > 40 mg.    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    Intervention Patient will attend CR 3 days/week with exercise and education.    Expected Outcomes Patient will complete the program meeting both personal and program goals.             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 He has multiple risk factors for CAD and is participating in the CR for risk modification. He has completed 3 sessions and his current weigth is 191.7 lbs maintained from his intial visit. His blood pressure is at goal. His DM is managed with Metformin. His last A1C was 6.4% 09/15/22. His personal goals for the program are to improve his diet and be able to get back to his normal activities like yard work. We will monitor his progress as he works toward meeting these goals. Navneet is doing well in rehab.  He said he was reading about Adesh is doing well in rehab. His weight is holding fairly steady for most part.  His pressures are doing well.  He has gotten away from checking them at home.  We talked about why checking and recording them at home can show his doctor what they are doing.  He will get back to checking again.  His blood sugars are running in 80-90 in AM fasting.  He checks them daily.  We talked about maintaining his sugars with diet and exercise to help manage his sugars.  He is doing well on his medications overall.     Expected Outcomes Patient will complete the program meeting both personal and program goals. -- Short: Get back to checking blood pressures at home Long: Continue to monitor risk factors and blood sugars.              Core Components/Risk Factors/Patient Goals at Discharge (Final Review):   Goals and Risk Factor Review - 11/21/22 0953       Core Components/Risk Factors/Patient Goals Review   Personal Goals Review Weight Management/Obesity;Diabetes;Lipids;Hypertension    Review Keyner is doing well in rehab. His weight is holding fairly steady for most part.  His pressures are doing well.  He has gotten away from checking them at home.  We talked about why checking and recording them at home can show his doctor what they are doing.  He will get back to checking again.  His blood sugars are running in 80-90 in AM fasting.  He checks them daily.   We talked about maintaining his sugars with diet and exercise to help manage his sugars.  He is doing well on his medications overall.    Expected Outcomes Short: Get back to checking blood pressures at home Long: Continue to monitor risk factors and blood sugars.             ITP Comments:  ITP Comments     Row Name 10/29/22 0933 11/12/22 0830 12/04/22 1325       ITP Comments First full day  of exercise!  Patient was oriented to gym and equipment including functions, settings, policies, and procedures. 30 day review completed. ITP sent to Dr. Dina Rich, Medical Director of Cardiac Rehab. Continue with ITP unless changes are made by physician.   Newer to program. Gery Pray graduated today from  rehab with 15 sessions completed.  Details of the patient's exercise prescription and what he  needs to do in order to continue the prescription and progress were discussed with patient.  Patient was given a copy of prescription and goals.  Patient verbalized understanding. Mekel plans to continue to exercise by walking and biking at home.              Comments: Discharge ITP

## 2022-12-04 NOTE — Progress Notes (Addendum)
Discharge Summary: AWESOME LYNG May 31, 1955   Stephen [name] graduated today from  rehab with 15 sessions completed.  Details of the patient's exercise prescription and what he  needs to do in order to continue the prescription and progress were discussed with patient.  Patient was given a copy of prescription and goals.  Patient verbalized understanding. [name] plans to continue to exercise by walking and biking at home.  6 Minute Walk     Row Name 10/27/22 1425 12/03/22 1013       6 Minute Walk   Phase Initial Discharge    Distance 1618 feet 1828 feet    Distance % Change -- 12.97 %    Distance Feet Change -- 210 ft    Walk Time 6 minutes 6 minutes    # of Rest Breaks 0 0    MPH 3.06 4.13    METS 3.4 3.46    RPE 9 12    VO2 Peak 11.89 14.44    Symptoms No No    Resting HR 65 bpm 65 bpm    Resting BP 110/60 128/72    Resting Oxygen Saturation  98 % --    Exercise Oxygen Saturation  during 6 min walk 98 % --    Max Ex. HR 75 bpm 103 bpm    Max Ex. BP 128/64 136/74    2 Minute Post BP 108/64 --

## 2022-12-05 ENCOUNTER — Encounter (HOSPITAL_COMMUNITY): Payer: 59

## 2022-12-08 ENCOUNTER — Telehealth: Payer: Self-pay | Admitting: Cardiovascular Disease

## 2022-12-08 ENCOUNTER — Encounter (HOSPITAL_COMMUNITY): Payer: 59

## 2022-12-08 NOTE — Telephone Encounter (Signed)
Letter typed, advised with patient letter was sent via mychart, but if needed he could come by and pick up in office he just needed this printed. Thanks!

## 2022-12-08 NOTE — Telephone Encounter (Signed)
Patient is calling about a return to work note that needs to state no restrictions for them to continue at work. Requesting call back to discuss.    Requesting that they pick up letter when available.

## 2022-12-08 NOTE — Telephone Encounter (Signed)
Will route to MD to review if okay for letter.  Thanks!

## 2022-12-10 ENCOUNTER — Encounter (HOSPITAL_COMMUNITY): Payer: 59

## 2022-12-12 ENCOUNTER — Encounter (HOSPITAL_COMMUNITY): Payer: 59

## 2022-12-15 ENCOUNTER — Encounter (HOSPITAL_COMMUNITY): Payer: 59

## 2022-12-17 ENCOUNTER — Encounter (HOSPITAL_COMMUNITY): Payer: 59

## 2022-12-19 ENCOUNTER — Encounter (HOSPITAL_COMMUNITY): Payer: 59

## 2022-12-22 ENCOUNTER — Encounter (HOSPITAL_COMMUNITY): Payer: 59

## 2022-12-23 NOTE — Progress Notes (Deleted)
Cardiology Office Note   Date:  12/23/2022  ID:  Stephen [name], DOB Jan 23, 1956, MRN [id number] PCP:  Georgann Housekeeper, MD [facility] Cardiologist: Reatha Harps, MD  Reason for visit:   History of Present Illness    Stephen [name] is a 67 y.o. male with a hx of ***    Objective / Physical Exam   EKG today: ***  Vital signs:  There were no vitals taken for this visit.    GEN: No acute distress NECK: No carotid bruits CARDIAC: ***RRR, no murmurs RESPIRATORY:  Clear to auscultation without rales, wheezing or rhonchi  EXTREMITIES: No edema  Assessment and Plan   ***   {Are you ordering a CV Procedure (e.g. stress test, cath, DCCV, TEE, etc)?   Press F2        :[id number]}    Signed, Bernette Mayers  12/23/2022 [facility]

## 2022-12-24 ENCOUNTER — Encounter (HOSPITAL_COMMUNITY): Payer: 59

## 2022-12-24 ENCOUNTER — Ambulatory Visit: Payer: 59 | Admitting: Physician Assistant

## 2022-12-26 ENCOUNTER — Encounter (HOSPITAL_COMMUNITY): Payer: 59

## 2022-12-29 ENCOUNTER — Encounter (HOSPITAL_COMMUNITY): Payer: 59

## 2022-12-31 ENCOUNTER — Encounter (HOSPITAL_COMMUNITY): Payer: 59

## 2023-01-02 ENCOUNTER — Encounter (HOSPITAL_COMMUNITY): Payer: 59

## 2023-01-05 ENCOUNTER — Encounter (HOSPITAL_COMMUNITY): Payer: 59

## 2023-01-07 ENCOUNTER — Encounter (HOSPITAL_COMMUNITY): Payer: 59

## 2023-01-09 ENCOUNTER — Encounter (HOSPITAL_COMMUNITY): Payer: 59

## 2023-01-12 ENCOUNTER — Encounter (HOSPITAL_COMMUNITY): Payer: 59

## 2023-01-14 ENCOUNTER — Encounter (HOSPITAL_COMMUNITY): Payer: 59

## 2023-01-16 ENCOUNTER — Encounter (HOSPITAL_COMMUNITY): Payer: 59

## 2023-01-27 ENCOUNTER — Ambulatory Visit: Payer: 59 | Admitting: Cardiology

## 2023-02-11 ENCOUNTER — Telehealth: Payer: Self-pay | Admitting: Cardiovascular Disease

## 2023-02-11 NOTE — Telephone Encounter (Signed)
Pt's cousin Rayvon Char is requesting a callback regarding an order being placed for pt to have a cardiac stress test done. Please advise.

## 2023-02-11 NOTE — Telephone Encounter (Signed)
Called, spoke with pt's cousin. Spoke with her and the pt. Appt made with Jari Favre. He has some questions about possible testing.

## 2023-02-11 NOTE — Telephone Encounter (Signed)
Called and spoke with Renita who is pt's cousin. She reports patient would like to have a street test if medically necessary. 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

## 2023-02-17 NOTE — Progress Notes (Unsigned)
Office Visit    Patient Name: Stephen [name] Date of Encounter: 02/18/2023  PCP:  Georgann Housekeeper, MD   [facility]  Cardiologist:  Reatha Harps, MD  Advanced Practice Provider:  No care team member to display Electrophysiologist:  None   HPI    Stephen [name] is a 67 y.o. male with a past medical history of hypertension, diabetes, recent NSTEMI with cardiac medication admitted 20 February 2 stents placed presents today for hospital follow-up.  Most recently seen in the ED 09/20/2022 for discoloration and swelling of the right wrist at the cath site.  Denied pain.  Did have some transient numbness to his thumb and small finger on the left side which resolved.  No weakness, numbness, or tingling.  No chest pain or shortness of breath.  No fever.  On aspirin and Brilinta as prescribed.  Other issues.  Cath site was evaluated and patient was ultimately discharged.  Arterial Doppler was negative for pseudoaneurysm, AV fistula, normal radial, ulnar, and palmar artery flow.  He saw me in the clinic 09/25/2022, he tells me that he has not had any chest pain but does get hot flashes melanite.  He sits up at the end of the bed and feels like he cannot breathe.  Once the hot flash resolves he can breathe much better.  He states his sleep cycle is off.  He goes to bed around 7 PM and is up intermittently throughout the night.  Gets up around 4 to 5 AM.  He is a truck driver by trade and we discussed filling out FMLA paperwork however, he has his 67th birthday on Sunday and may consider retiring.  We discussed resuming his normal activities but using commonsense and taking lots of breaks.  We discussed enrolling in cardiac rehab  He saw me 12/02/22, he tells me he has been enrolled in cardiac rehab and doing really well.  He is about 6 weeks in.  No chest pain or shortness of breath.  He is taking Tylenol occasionally for muscular pain.  He went to the gym the other day and tried to  use a 30 pound weight and do 30 reps of bicep curls and his pectoralis muscle was tight the next day.  Eventually subsided.  Discussed gradually increasing weight over several weeks.  We plan for a follow-up lipid panel in November.  Last LDL was 76.  We discussed having a goal less than 55 for prevention of future heart attack/stroke.  He does drive a truck short distances (about an hour there and an hour back).  He wants to go back to work for a few weeks starting next Monday and then plans to retire at the end of the month.  I think this is fine since the patient is asymptomatic at this time and feeling well.  Return to work note provided for him today.  Today, he tells me that he needs a stress test for his DOT clearance.  He has not had any shortness of breath but does endorse occasional chest pain that does not last long on his left side.  Not associated with exercise.  Still able to exercise at a high intensity level.  Compliant with all medications.  Reports no shortness of breath nor dyspnea on exertion.  No edema, orthopnea, PND. Reports no palpitations.   Past Medical History    Past Medical History:  Diagnosis Date   Cancer [facility]) 2008   prostate  History of inguinal hernia repair    right   Hyperlipidemia    Hypertension    Past Surgical History:  Procedure Laterality Date   CORONARY/GRAFT ACUTE MI REVASCULARIZATION N/A 09/14/2022   Procedure: Coronary/Graft Acute MI Revascularization;  Surgeon: Tonny Bollman, MD;  Location: [facility] INVASIVE CV LAB;  Service: Cardiovascular;  Laterality: N/A;   HEMORRHOID SURGERY     HERNIA REPAIR     INGUINAL HERNIA REPAIR  03/28/2011   Procedure: HERNIA REPAIR INGUINAL ADULT;  Surgeon: Robyne Askew, MD;  Location: [facility];  Service: General;  Laterality: Right;   LEFT HEART CATH AND CORONARY ANGIOGRAPHY N/A 09/14/2022   Procedure: LEFT HEART CATH AND CORONARY ANGIOGRAPHY;  Surgeon: Tonny Bollman, MD;  Location: [facility] INVASIVE  CV LAB;  Service: Cardiovascular;  Laterality: N/A;   PROSTATE SURGERY  02/17/07    Allergies  No Known Allergies   EKGs/Labs/Other Studies Reviewed:   The following studies were reviewed today: Cardiac Studies & Procedures   CARDIAC CATHETERIZATION  CARDIAC CATHETERIZATION 09/14/2022  Narrative   Prox RCA lesion is 40% stenosed.   1st Mrg lesion is 100% stenosed.   Prox LAD to Mid LAD lesion is 90% stenosed.   A drug-eluting stent was successfully placed using a STENT ONYX FRONTIER 2.5X26.   A drug-eluting stent was successfully placed using a STENT ONYX FRONTIER 3.5X18.   Post intervention, there is a 0% residual stenosis.   Post intervention, there is a 0% residual stenosis.  1.  Severe two-vessel coronary artery disease with total occlusion of the first OM branch of the circumflex and severe stenosis of the proximal to mid LAD, both lesions treated with PCI using a single drug-eluting stent at each lesion site 2.  Patent RCA with mild nonobstructive disease 3.  Normal LVEF by echo assessment with no regional wall motion abnormalities, normal LVEDP at cardiac catheterization  Recommendations: Aggressive medical therapy, DAPT with aspirin and ticagrelor without interruption for 12 months, if no complications arise should be appropriate for hospital discharge tomorrow morning.  Findings Coronary Findings Diagnostic  Dominance: Right  Left Anterior Descending Prox LAD to Mid LAD lesion is 90% stenosed. The lesion is eccentric. The lesion is mildly calcified.  Left Circumflex  First Obtuse Marginal Branch 1st Mrg lesion is 100% stenosed. The lesion is thrombotic.  Right Coronary Artery There is mild diffuse disease throughout the vessel. Dominant vessel, PDA and PLA branches are both patent.  There is mild nonobstructive plaquing throughout the mid and distal RCA without any significant high-grade stenoses. Prox RCA lesion is 40% stenosed.  Intervention  Prox LAD to  Mid LAD lesion Stent A drug-eluting stent was successfully placed using a STENT ONYX FRONTIER 3.5X18. Post-Intervention Lesion Assessment The intervention was successful. Pre-interventional TIMI flow is 3. Post-intervention TIMI flow is 3. No complications occurred at this lesion. There is a 0% residual stenosis post intervention.  1st Mrg lesion Stent A drug-eluting stent was successfully placed using a STENT ONYX FRONTIER 2.5X26. Post-Intervention Lesion Assessment The intervention was successful. Pre-interventional TIMI flow is 0. Post-intervention TIMI flow is 3. No complications occurred at this lesion. There is a 0% residual stenosis post intervention.     ECHOCARDIOGRAM  ECHOCARDIOGRAM COMPLETE 09/14/2022  Narrative ECHOCARDIOGRAM REPORT    Patient Name:   WAFI SIMOES Date of Exam: 09/14/2022 Medical Rec #:  [id number]      Height:       68.0 in Accession #:    [id number]  Weight:       193.5 lb Date of Birth:  05/22/1955       BSA:          2.015 m Patient Age:    66 years       BP:           125/86 mmHg Patient Gender: M              HR:           76 bpm. Exam Location:  Inpatient  Procedure: Strain Analysis, 2D Echo, Color Doppler and Cardiac Doppler  Indications:    abnormal ecg  History:        Patient has no prior history of Echocardiogram examinations. Signs/Symptoms:Chest Pain; Risk Factors:Diabetes and Hypertension.  Sonographer:    Delcie Roch RDCS Referring Phys: [id number] [facility] S KHAN   Sonographer Comments: Global longitudinal strain was attempted. IMPRESSIONS   1. Left ventricular ejection fraction, by estimation, is 60 to 65%. The left ventricle has normal function. The left ventricle has no regional wall motion abnormalities. There is moderate concentric left ventricular hypertrophy. Left ventricular diastolic parameters are consistent with Grade I diastolic dysfunction (impaired relaxation). 2. Right ventricular systolic function is  normal. The right ventricular size is normal. Tricuspid regurgitation signal is inadequate for assessing PA pressure. 3. The mitral valve is normal in structure. No evidence of mitral valve regurgitation. 4. The aortic valve is tricuspid. Aortic valve regurgitation is not visualized. 5. The inferior vena cava is normal in size with greater than 50% respiratory variability, suggesting right atrial pressure of 3 mmHg.  Conclusion(s)/Recommendation(s): Normal biventricular function without evidence of hemodynamically significant valvular heart disease.  FINDINGS Left Ventricle: Left ventricular ejection fraction, by estimation, is 60 to 65%. The left ventricle has normal function. The left ventricle has no regional wall motion abnormalities. The left ventricular internal cavity size was normal in size. There is moderate concentric left ventricular hypertrophy. Left ventricular diastolic parameters are consistent with Grade I diastolic dysfunction (impaired relaxation).  Right Ventricle: The right ventricular size is normal. Right ventricular systolic function is normal. Tricuspid regurgitation signal is inadequate for assessing PA pressure.  Left Atrium: Left atrial size was normal in size.  Right Atrium: Right atrial size was normal in size.  Pericardium: There is no evidence of pericardial effusion.  Mitral Valve: The mitral valve is normal in structure. No evidence of mitral valve regurgitation.  Tricuspid Valve: Tricuspid valve regurgitation is not demonstrated.  Aortic Valve: The aortic valve is tricuspid. Aortic valve regurgitation is not visualized.  Pulmonic Valve: Pulmonic valve regurgitation is not visualized.  Aorta: The aortic root and ascending aorta are structurally normal, with no evidence of dilitation.  Venous: The inferior vena cava is normal in size with greater than 50% respiratory variability, suggesting right atrial pressure of 3 mmHg.  IAS/Shunts: No atrial level  shunt detected by color flow Doppler.   LEFT VENTRICLE PLAX 2D LVIDd:         4.40 cm   Diastology LVIDs:         3.40 cm   LV e' medial:    4.90 cm/s LV PW:         1.50 cm   LV E/e' medial:  14.2 LV IVS:        1.50 cm   LV e' lateral:   7.07 cm/s LVOT diam:     2.10 cm   LV E/e' lateral: 9.8 LV SV:  63 LV SV Index:   31        2D Longitudinal Strain LVOT Area:     3.46 cm  2D Strain GLS Avg:     -14.0 %   RIGHT VENTRICLE RV Basal diam:  2.60 cm RV S prime:     12.80 cm/s TAPSE (M-mode): 1.6 cm  LEFT ATRIUM             Index        RIGHT ATRIUM           Index LA diam:        3.80 cm 1.89 cm/m   RA Area:     10.50 cm LA Vol (A2C):   30.4 ml 15.09 ml/m  RA Volume:   19.90 ml  9.87 ml/m LA Vol (A4C):   28.3 ml 14.04 ml/m LA Biplane Vol: 30.9 ml 15.33 ml/m AORTIC VALVE LVOT Vmax:   110.00 cm/s LVOT Vmean:  65.500 cm/s LVOT VTI:    0.181 m  AORTA Ao Root diam: 3.70 cm Ao Asc diam:  3.00 cm  MITRAL VALVE MV Area (PHT): 3.91 cm    SHUNTS MV Decel Time: 194 msec    Systemic VTI:  0.18 m MV E velocity: 69.50 cm/s  Systemic Diam: 2.10 cm MV A velocity: 91.30 cm/s MV E/A ratio:  0.76  [place] signed by Carolan Clines Signature Date/Time: 09/14/2022/10:35:11 AM    Final              EKG:  EKG is not ordered today.    Recent Labs: 09/14/2022: B Natriuretic Peptide 69.4 09/15/2022: TSH 0.581 09/20/2022: Hemoglobin 14.1; Platelets 219 09/24/2022: BUN 18; Creatinine, Ser 1.11; Potassium 3.9; Sodium 135  Recent Lipid Panel    Component Value Date/Time   CHOL 144 09/14/2022 0650   TRIG 37 09/14/2022 0650   HDL 61 09/14/2022 0650   CHOLHDL 2.4 09/14/2022 0650   VLDL 7 09/14/2022 0650   LDLCALC 76 09/14/2022 0650    Home Medications   Current Meds  Medication Sig   aspirin EC 81 MG tablet Take 1 tablet (81 mg total) by mouth daily. Swallow whole.   atorvastatin (LIPITOR) 80 MG tablet Take 1 tablet (80 mg total) by mouth daily.    clopidogrel (PLAVIX) 75 MG tablet Take 1 tablet (75 mg total) by mouth daily. Take 4 tablets (300 mg) the first day. Then each day after take 1 tablet (75 mg) daily   empagliflozin (JARDIANCE) 10 MG TABS tablet Take 1 tablet (10 mg total) by mouth daily before breakfast.   esomeprazole (NEXIUM) 20 MG capsule Take 20 mg by mouth as needed.   Glucosamine Sulfate 1000 MG CAPS Take 2 capsules by mouth daily.   losartan (COZAAR) 50 MG tablet 1 tablet Orally Once a day for 90 days   metFORMIN (GLUCOPHAGE) 500 MG tablet Take 1 tablet (500 mg total) by mouth daily.   metoprolol succinate (TOPROL XL) 25 MG 24 hr tablet Take 1 tablet (25 mg total) by mouth daily.   nitroGLYCERIN (NITROSTAT) 0.4 MG SL tablet Place 1 tablet (0.4 mg total) under the tongue every 5 (five) minutes x 3 doses as needed for chest pain.   polyethylene glycol powder (MIRALAX) 17 GM/SCOOP powder Take 0.5 Containers by mouth as needed for moderate constipation.     Review of Systems      All other systems reviewed and are otherwise negative except as noted above.  Physical Exam    VS:  BP 134/78   Pulse 74   Ht 5\' 8"  (1.727 m)   Wt 189 lb 6.4 oz (85.9 kg)   SpO2 97%   BMI 28.80 kg/m  , BMI Body mass index is 28.8 kg/m.  Wt Readings from Last 3 Encounters:  02/18/23 189 lb 6.4 oz (85.9 kg)  12/03/22 186 lb 14.4 oz (84.8 kg)  12/02/22 190 lb (86.2 kg)    GEN: Well nourished, well developed, in no acute distress. HEENT: normal. Neck: Supple, no JVD, carotid bruits, or masses. Cardiac: RRR, no murmurs, rubs, or gallops. No clubbing, cyanosis, edema.  Radials/PT 2+ and equal bilaterally.  Respiratory:  Respirations regular and unlabored, clear to auscultation bilaterally. GI: Soft, nontender, nondistended. MS: No deformity or atrophy. Skin: Warm and dry, no rash. Neuro:  Strength and sensation are intact. Psych: Normal affect.  Assessment & Plan    CAD status post PCI to proximal to mid LAD and first OM  branch -Continue current medications which include aspirin 81 mg daily, Lipitor 80 mg daily, Jardiance 10 mg daily, metoprolol succinate 25 mg daily, nitro as needed, Brilinta 90 mg twice a day -DAPT for at least 6 months -no further symptoms  Hypertension -Blood pressure well-controlled today 134/78 -Continue current medication regimen -Continue heart healthy, low-sodium diet  HLD -Continue Lipitor 80 mg daily -Will need an updated lipid panel at his next visit-scheduled for Nov with PCP -New LDL goal will be less than 55  DM type II -Most recent A1c 6.4 -Continue control per primary -The Jardiance has really helped his blood glucose level and I would think it would be lower on recheck        Disposition: Follow up 4 months  with Reatha Harps, MD or APP.  Signed, Sharlene Dory, PA-C 02/18/2023, 10:25 AM [facility]

## 2023-02-18 ENCOUNTER — Ambulatory Visit: Payer: Medicare HMO | Attending: Physician Assistant | Admitting: Physician Assistant

## 2023-02-18 ENCOUNTER — Encounter: Payer: Self-pay | Admitting: *Deleted

## 2023-02-18 ENCOUNTER — Encounter: Payer: Self-pay | Admitting: Physician Assistant

## 2023-02-18 VITALS — BP 134/78 | HR 74 | Ht 68.0 in | Wt 189.4 lb

## 2023-02-18 DIAGNOSIS — I10 Essential (primary) hypertension: Secondary | ICD-10-CM

## 2023-02-18 DIAGNOSIS — I2510 Atherosclerotic heart disease of native coronary artery without angina pectoris: Secondary | ICD-10-CM

## 2023-02-18 DIAGNOSIS — E785 Hyperlipidemia, unspecified: Secondary | ICD-10-CM

## 2023-02-18 DIAGNOSIS — Z0289 Encounter for other administrative examinations: Secondary | ICD-10-CM

## 2023-02-18 DIAGNOSIS — E118 Type 2 diabetes mellitus with unspecified complications: Secondary | ICD-10-CM

## 2023-02-18 NOTE — Patient Instructions (Signed)
Medication Instructions:  Your physician recommends that you continue on your current medications as directed. Please refer to the Current Medication list given to you today.  *If you need a refill on your cardiac medications before your next appointment, please call your pharmacy*  Lab Work: None ordered If you have labs (blood work) drawn today and your tests are completely normal, you will receive your results only by: MyChart Message (if you have MyChart) OR A paper copy in the mail If you have any lab test that is abnormal or we need to change your treatment, we will call you to review the results.  Testing/Procedures: Your physician has requested that you have an exercise myoview. For further information please visit [email or web address] Please follow instruction sheet, as given.   Follow-Up: At [facility], you and your health needs are our priority.  As part of our continuing mission to provide you with exceptional heart care, we have created designated Provider Care Teams.  These Care Teams include your primary Cardiologist (physician) and Advanced Practice Providers (APPs -  Physician Assistants and Nurse Practitioners) who all work together to provide you with the care you need, when you need it.  We recommend signing up for the patient portal called "MyChart".  Sign up information is provided on this After Visit Summary.  MyChart is used to connect with patients for Virtual Visits (Telemedicine).  Patients are able to view lab/test results, encounter notes, upcoming appointments, etc.  Non-urgent messages can be sent to your provider as well.   To learn more about what you can do with MyChart, go to [email or web address].    Your next appointment:   As scheduled  Provider:   Reatha Harps, MD     Low-Sodium Eating Plan Salt (sodium) helps you keep a healthy balance of fluids in your body. Too much sodium can raise your blood pressure. It can also cause fluid and  waste to be held in your body. Your health care provider or dietitian may recommend a low-sodium eating plan if you have high blood pressure (hypertension), kidney disease, liver disease, or heart failure. Eating less sodium can help lower your blood pressure and reduce swelling. It can also protect your heart, liver, and kidneys. What are tips for following this plan? Reading food labels  Check food labels for the amount of sodium per serving. If you eat more than one serving, you must multiply the listed amount by the number of servings. Choose foods with less than 140 milligrams (mg) of sodium per serving. Avoid foods with 300 mg of sodium or more per serving. Always check how much sodium is in a product, even if the label says "unsalted" or "no salt added." Shopping  Buy products labeled as "low-sodium" or "no salt added." Buy fresh foods. Avoid canned foods and pre-made or frozen meals. Avoid canned, cured, or processed meats. Buy breads that have less than 80 mg of sodium per slice. Cooking  Eat more home-cooked food. Try to eat less restaurant, buffet, and fast food. Try not to add salt when you cook. Use salt-free seasonings or herbs instead of table salt or sea salt. Check with your provider or pharmacist before using salt substitutes. Cook with plant-based oils, such as canola, sunflower, or olive oil. Meal planning When eating at a restaurant, ask if your food can be made with less salt or no salt. Avoid dishes labeled as brined, pickled, cured, or smoked. Avoid dishes made with soy sauce, miso, or teriyaki sauce.  Avoid foods that have monosodium glutamate (MSG) in them. MSG may be added to some restaurant food, sauces, soups, bouillon, and canned foods. Make meals that can be grilled, baked, poached, roasted, or steamed. These are often made with less sodium. General information Try to limit your sodium intake to 1,500-2,300 mg each day, or the amount told by your  provider. What foods should I eat? Fruits Fresh, frozen, or canned fruit. Fruit juice. Vegetables Fresh or frozen vegetables. "No salt added" canned vegetables. "No salt added" tomato sauce and paste. Low-sodium or reduced-sodium tomato and vegetable juice. Grains Low-sodium cereals, such as oats, puffed wheat and rice, and shredded wheat. Low-sodium crackers. Unsalted rice. Unsalted pasta. Low-sodium bread. Whole grain breads and whole grain pasta. Meats and other proteins Fresh or frozen meat, poultry, seafood, and fish. These should have no added salt. Low-sodium canned tuna and salmon. Unsalted nuts. Dried peas, beans, and lentils without added salt. Unsalted canned beans. Eggs. Unsalted nut butters. Dairy Milk. Soy milk. Cheese that is naturally low in sodium, such as ricotta cheese, fresh mozzarella, or Swiss cheese. Low-sodium or reduced-sodium cheese. Cream cheese. Yogurt. Seasonings and condiments Fresh and dried herbs and spices. Salt-free seasonings. Low-sodium mustard and ketchup. Sodium-free salad dressing. Sodium-free light mayonnaise. Fresh or refrigerated horseradish. Lemon juice. Vinegar. Other foods Homemade, reduced-sodium, or low-sodium soups. Unsalted popcorn and pretzels. Low-salt or salt-free chips. The items listed above may not be all the foods and drinks you can have. Talk to a dietitian to learn more. What foods should I avoid? Vegetables Sauerkraut, pickled vegetables, and relishes. Olives. [place] fries. Onion rings. Regular canned vegetables, except low-sodium or reduced-sodium items. Regular canned tomato sauce and paste. Regular tomato and vegetable juice. Frozen vegetables in sauces. Grains Instant hot cereals. Bread stuffing, pancake, and biscuit mixes. Croutons. Seasoned rice or pasta mixes. Noodle soup cups. Boxed or frozen macaroni and cheese. Regular salted crackers. Self-rising flour. Meats and other proteins Meat or fish that is salted, canned, smoked,  spiced, or pickled. Precooked or cured meat, such as sausages or meat loaves. Tomasa Blase. Ham. Pepperoni. Hot dogs. Corned beef. Chipped beef. Salt pork. Jerky. Pickled herring, anchovies, and sardines. Regular canned tuna. Salted nuts. Dairy Processed cheese and cheese spreads. Hard cheeses. Cheese curds. Blue cheese. Feta cheese. String cheese. Regular cottage cheese. Buttermilk. Canned milk. Fats and oils Salted butter. Regular margarine. Ghee. Bacon fat. Seasonings and condiments Onion salt, garlic salt, seasoned salt, table salt, and sea salt. Canned and packaged gravies. Worcestershire sauce. Tartar sauce. Barbecue sauce. Teriyaki sauce. Soy sauce, including reduced-sodium soy sauce. Steak sauce. Fish sauce. Oyster sauce. Cocktail sauce. Horseradish that you find on the shelf. Regular ketchup and mustard. Meat flavorings and tenderizers. Bouillon cubes. Hot sauce. Pre-made or packaged marinades. Pre-made or packaged taco seasonings. Relishes. Regular salad dressings. Salsa. Other foods Salted popcorn and pretzels. Corn chips and puffs. Potato and tortilla chips. Canned or dried soups. Pizza. Frozen entrees and pot pies. The items listed above may not be all the foods and drinks you should avoid. Talk to a dietitian to learn more. This information is not intended to replace advice given to you by your health care provider. Make sure you discuss any questions you have with your health care provider. Document Revised: 05/01/2022 Document Reviewed: 05/01/2022 Elsevier Patient Education  2024 [employer] Heart-Healthy Eating Plan Many factors influence your heart health, including eating and exercise habits. Heart health is also called coronary health. Coronary risk increases with abnormal blood fat (lipid) levels. A  heart-healthy eating plan includes limiting unhealthy fats, increasing healthy fats, limiting salt (sodium) intake, and making other diet and lifestyle changes. What is my plan? Your  health care provider may recommend that: You limit your fat intake to _________% or less of your total calories each day. You limit your saturated fat intake to _________% or less of your total calories each day. You limit the amount of cholesterol in your diet to less than _________ mg per day. You limit the amount of sodium in your diet to less than _________ mg per day. What are tips for following this plan? Cooking Cook foods using methods other than frying. Baking, boiling, grilling, and broiling are all good options. Other ways to reduce fat include: Removing the skin from poultry. Removing all visible fats from meats. Steaming vegetables in water or broth. Meal planning  At meals, imagine dividing your plate into fourths: Fill one-half of your plate with vegetables and green salads. Fill one-fourth of your plate with whole grains. Fill one-fourth of your plate with lean protein foods. Eat 2-4 cups of vegetables per day. One cup of vegetables equals 1 cup (91 g) broccoli or cauliflower florets, 2 medium carrots, 1 large bell pepper, 1 large sweet potato, 1 large tomato, 1 medium white potato, 2 cups (150 g) raw leafy greens. Eat 1-2 cups of fruit per day. One cup of fruit equals 1 small apple, 1 large banana, 1 cup (237 g) mixed fruit, 1 large orange,  cup (82 g) dried fruit, 1 cup (240 mL) 100% fruit juice. Eat more foods that contain soluble fiber. Examples include apples, broccoli, carrots, beans, peas, and barley. Aim to get 25-30 g of fiber per day. Increase your consumption of legumes, nuts, and seeds to 4-5 servings per week. One serving of dried beans or legumes equals  cup (90 g) cooked, 1 serving of nuts is  oz (12 almonds, 24 pistachios, or 7 walnut halves), and 1 serving of seeds equals  oz (8 g). Fats Choose healthy fats more often. Choose monounsaturated and polyunsaturated fats, such as olive and canola oils, avocado oil, flaxseeds, walnuts, almonds, and seeds. Eat  more omega-3 fats. Choose salmon, mackerel, sardines, tuna, flaxseed oil, and ground flaxseeds. Aim to eat fish at least 2 times each week. Check food labels carefully to identify foods with trans fats or high amounts of saturated fat. Limit saturated fats. These are found in animal products, such as meats, butter, and cream. Plant sources of saturated fats include palm oil, palm kernel oil, and coconut oil. Avoid foods with partially hydrogenated oils in them. These contain trans fats. Examples are stick margarine, some tub margarines, cookies, crackers, and other baked goods. Avoid fried foods. General information Eat more home-cooked food and less restaurant, buffet, and fast food. Limit or avoid alcohol. Limit foods that are high in added sugar and simple starches such as foods made using white refined flour (white breads, pastries, sweets). Lose weight if you are overweight. Losing just 5-10% of your body weight can help your overall health and prevent diseases such as diabetes and heart disease. Monitor your sodium intake, especially if you have high blood pressure. Talk with your health care provider about your sodium intake. Try to incorporate more vegetarian meals weekly. What foods should I eat? Fruits All fresh, canned (in natural juice), or frozen fruits. Vegetables Fresh or frozen vegetables (raw, steamed, roasted, or grilled). Green salads. Grains Most grains. Choose whole wheat and whole grains most of the time. Rice  and pasta, including brown rice and pastas made with whole wheat. Meats and other proteins Lean, well-trimmed beef, veal, pork, and lamb. Chicken and [place] without skin. All fish and shellfish. Wild duck, rabbit, pheasant, and venison. Egg whites or low-cholesterol egg substitutes. Dried beans, peas, lentils, and tofu. Seeds and most nuts. Dairy Low-fat or nonfat cheeses, including ricotta and mozzarella. Skim or 1% milk (liquid, powdered, or evaporated). Buttermilk  made with low-fat milk. Nonfat or low-fat yogurt. Fats and oils Non-hydrogenated (trans-free) margarines. Vegetable oils, including soybean, sesame, sunflower, olive, avocado, peanut, safflower, corn, canola, and cottonseed. Salad dressings or mayonnaise made with a vegetable oil. Beverages Water (mineral or sparkling). Coffee and tea. Unsweetened ice tea. Diet beverages. Sweets and desserts Sherbet, gelatin, and fruit ice. Small amounts of dark chocolate. Limit all sweets and desserts. Seasonings and condiments All seasonings and condiments. The items listed above may not be a complete list of foods and beverages you can eat. Contact a dietitian for more options. What foods should I avoid? Fruits Canned fruit in heavy syrup. Fruit in cream or butter sauce. Fried fruit. Limit coconut. Vegetables Vegetables cooked in cheese, cream, or butter sauce. Fried vegetables. Grains Breads made with saturated or trans fats, oils, or whole milk. Croissants. Sweet rolls. Donuts. High-fat crackers, such as cheese crackers and chips. Meats and other proteins Fatty meats, such as hot dogs, ribs, sausage, bacon, rib-eye roast or steak. High-fat deli meats, such as salami and bologna. Caviar. Domestic duck and goose. Organ meats, such as liver. Dairy Cream, sour cream, cream cheese, and creamed cottage cheese. Whole-milk cheeses. Whole or 2% milk (liquid, evaporated, or condensed). Whole buttermilk. Cream sauce or high-fat cheese sauce. Whole-milk yogurt. Fats and oils Meat fat, or shortening. Cocoa butter, hydrogenated oils, palm oil, coconut oil, palm kernel oil. Solid fats and shortenings, including bacon fat, salt pork, lard, and butter. Nondairy cream substitutes. Salad dressings with cheese or sour cream. Beverages Regular sodas and any drinks with added sugar. Sweets and desserts Frosting. Pudding. Cookies. Cakes. Pies. Milk chocolate or white chocolate. Buttered syrups. Full-fat ice cream or ice  cream drinks. The items listed above may not be a complete list of foods and beverages to avoid. Contact a dietitian for more information. Summary Heart-healthy meal planning includes limiting unhealthy fats, increasing healthy fats, limiting salt (sodium) intake and making other diet and lifestyle changes. Lose weight if you are overweight. Losing just 5-10% of your body weight can help your overall health and prevent diseases such as diabetes and heart disease. Focus on eating a balance of foods, including fruits and vegetables, low-fat or nonfat dairy, lean protein, nuts and legumes, whole grains, and heart-healthy oils and fats. This information is not intended to replace advice given to you by your health care provider. Make sure you discuss any questions you have with your health care provider. Document Revised: 05/20/2021 Document Reviewed: 05/20/2021 Elsevier Patient Education  2024 [employer].

## 2023-02-25 ENCOUNTER — Telehealth (HOSPITAL_COMMUNITY): Payer: Self-pay | Admitting: *Deleted

## 2023-02-25 NOTE — Telephone Encounter (Signed)
Patient given detailed instructions per Myocardial Perfusion Study Information Sheet for the test on 03/03/2023 at 10:00. Patient notified to arrive 15 minutes early and that it is imperative to arrive on time for appointment to keep from having the test rescheduled.  If you need to cancel or reschedule your appointment, please call the office within 24 hours of your appointment. . Patient verbalized understanding.Stephen [name]

## 2023-02-26 DIAGNOSIS — H01001 Unspecified blepharitis right upper eyelid: Secondary | ICD-10-CM | POA: Diagnosis not present

## 2023-02-26 DIAGNOSIS — H01002 Unspecified blepharitis right lower eyelid: Secondary | ICD-10-CM | POA: Diagnosis not present

## 2023-02-26 DIAGNOSIS — H402231 Chronic angle-closure glaucoma, bilateral, mild stage: Secondary | ICD-10-CM | POA: Diagnosis not present

## 2023-02-26 DIAGNOSIS — H2513 Age-related nuclear cataract, bilateral: Secondary | ICD-10-CM | POA: Diagnosis not present

## 2023-02-26 DIAGNOSIS — H40033 Anatomical narrow angle, bilateral: Secondary | ICD-10-CM | POA: Diagnosis not present

## 2023-03-03 ENCOUNTER — Ambulatory Visit (HOSPITAL_COMMUNITY): Payer: Medicare HMO | Attending: Internal Medicine

## 2023-03-03 DIAGNOSIS — I252 Old myocardial infarction: Secondary | ICD-10-CM | POA: Diagnosis not present

## 2023-03-03 DIAGNOSIS — I2510 Atherosclerotic heart disease of native coronary artery without angina pectoris: Secondary | ICD-10-CM

## 2023-03-03 DIAGNOSIS — Z0289 Encounter for other administrative examinations: Secondary | ICD-10-CM | POA: Diagnosis not present

## 2023-03-03 DIAGNOSIS — R0789 Other chest pain: Secondary | ICD-10-CM | POA: Diagnosis present

## 2023-03-03 LAB — MYOCARDIAL PERFUSION IMAGING
Angina Index: 0
Duke Treadmill Score: 10
Estimated workload: 11.1
Exercise duration (min): 9 min
Exercise duration (sec): 39 s
LV dias vol: 104 mL (ref 62–150)
LV sys vol: 61 mL
MPHR: 155 {beats}/min
Nuc Stress EF: 41 %
Peak HR: 153 {beats}/min
Percent HR: 98 %
Rest HR: 63 {beats}/min
Rest Nuclear Isotope Dose: 8.3 mCi
SDS: 0
SRS: 4
SSS: 5
ST Depression (mm): 0 mm
Stress Nuclear Isotope Dose: 27.3 mCi
TID: 0.97

## 2023-03-03 MED ORDER — TECHNETIUM TC 99M TETROFOSMIN IV KIT
8.3000 | PACK | Freq: Once | INTRAVENOUS | Status: AC | PRN
  Administered 2023-03-03: 8.3 via INTRAVENOUS

## 2023-03-03 MED ORDER — TECHNETIUM TC 99M TETROFOSMIN IV KIT
27.3000 | PACK | Freq: Once | INTRAVENOUS | Status: AC | PRN
  Administered 2023-03-03: 27.3 via INTRAVENOUS

## 2023-03-09 DIAGNOSIS — C61 Malignant neoplasm of prostate: Secondary | ICD-10-CM | POA: Diagnosis not present

## 2023-03-11 DIAGNOSIS — I700 Atherosclerosis of aorta: Secondary | ICD-10-CM | POA: Diagnosis not present

## 2023-03-11 DIAGNOSIS — E1136 Type 2 diabetes mellitus with diabetic cataract: Secondary | ICD-10-CM | POA: Diagnosis not present

## 2023-03-11 DIAGNOSIS — I2510 Atherosclerotic heart disease of native coronary artery without angina pectoris: Secondary | ICD-10-CM | POA: Diagnosis not present

## 2023-03-11 DIAGNOSIS — Z0000 Encounter for general adult medical examination without abnormal findings: Secondary | ICD-10-CM | POA: Diagnosis not present

## 2023-03-11 DIAGNOSIS — Z23 Encounter for immunization: Secondary | ICD-10-CM | POA: Diagnosis not present

## 2023-03-11 DIAGNOSIS — I10 Essential (primary) hypertension: Secondary | ICD-10-CM | POA: Diagnosis not present

## 2023-03-11 DIAGNOSIS — C61 Malignant neoplasm of prostate: Secondary | ICD-10-CM | POA: Diagnosis not present

## 2023-03-11 DIAGNOSIS — N341 Nonspecific urethritis: Secondary | ICD-10-CM | POA: Diagnosis not present

## 2023-03-11 DIAGNOSIS — E782 Mixed hyperlipidemia: Secondary | ICD-10-CM | POA: Diagnosis not present

## 2023-04-27 DIAGNOSIS — H2512 Age-related nuclear cataract, left eye: Secondary | ICD-10-CM | POA: Diagnosis not present

## 2023-04-28 ENCOUNTER — Encounter (HOSPITAL_COMMUNITY): Payer: Self-pay

## 2023-04-28 ENCOUNTER — Encounter (HOSPITAL_COMMUNITY)
Admission: RE | Admit: 2023-04-28 | Discharge: 2023-04-28 | Disposition: A | Discharge status: Home / Self Care | Payer: Medicare HMO | Source: Ambulatory Visit | Attending: Ophthalmology | Admitting: Ophthalmology

## 2023-04-28 HISTORY — DX: Type 2 diabetes mellitus without complications: E11.9

## 2023-04-28 HISTORY — DX: Other specified postprocedural states: R11.2

## 2023-04-28 HISTORY — DX: Other specified postprocedural states: Z98.890

## 2023-04-30 NOTE — H&P (Signed)
 Surgical History & Physical  Patient Name: Stephen [name]  DOB: 1955/05/03  Surgery: Cataract extraction with intraocular lens implant phacoemulsification; Left Eye Surgeon: Lynwood Hermann MD Surgery Date: 05/01/2023 Pre-Op Date: 02/26/2023  HPI: A 24 Yr. old male patient 1. The patient is here for a glaucoma eval of both eyes, Ref: Dr. Darroll. Pt, states the affected area is doing well without pain or irritation. The patient's vision is stable with blurriness, including reading fine print, captions on the TV, and glare problems at night and during the day.. The condition's severity is constant. Patient is not taking medications. HPI was performed by Lynwood Hermann .  Medical History: Glaucoma suspect Cataracts  Diabetes Heart Problem High Blood Pressure  Review of Systems Negative Allergic/Immunologic Heart problems/ Hypertension Cardiovascular Negative Constitutional Negative Ear, Nose, Mouth & Throat diabetes Endocrine Cataracts Eyes Negative Gastrointestinal Negative Genitourinary Negative Hemotologic/Lymphatic Negative Integumentary Negative Musculoskeletal Negative Neurological Negative Psychiatry Negative Respiratory  Social Never smoked    Medication losartan  ,  metformin  ,  clopidogrel  ,  atorvastatin  ,  nitroglycerin  ,  metoprolol  succinate ,  Jardiance  ,  prednisolone acetate ,  Ilevro ,  moxifloxacin    Sx/Procedures Hernia Sx, Prostate CA surgery  Drug Allergies  NKDA  History & Physical: Heent: cataracts  NECK: supple without bruits LUNGS: lungs clear to auscultation CV: regular rate and rhythm Abdomen: soft and non-tender  Impression & Plan: Assessment: 1.  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  NUCLEAR SCLEROSIS AGE RELATED; Both Eyes (H25.13) 3.  BLEPHARITIS; Right Upper Lid, Right Lower Lid, Left Upper Lid, Left Lower Lid (H01.001, H01.002,H01.004,H01.005) 4.  DERMATOCHALASIS, no surgery; Right Upper Lid, Left Upper Lid (H02.831,  H02.834) 5.  Pinguecula; Both Eyes (H11.153) 6.  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  Worse left eye. 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 Recommend phaco PCIOL in both eyes to relieve risk of acute angle closure attack.  2.  Cataract accounts for the patient's decreased vision and also for phacomorphic occludable angles, bothe eys.. This visual impairment is not correctable with a tolerable change in glasses or contact lenses. Cataract surgery with an implantation of a new lens should significantly improve the visual and functional status of the patient as well as greatly reduce the risk of acute glaucoma attack. Discussed all risks, benefits, alternatives, and potential complications. Discussed the procedures and recovery. Patient desires to have surgery. A-scan ordered and performed today for intra-ocular lens calculations. The surgery will be performed in order to improve vision for driving, reading, and for eye examinations. Recommend phacoemulsification with intra-ocular lens. Recommend Dextenza  for post-operative pain and inflammation. Left Eye more occludable - first. Did not dilate today due to high risk of acute angle closure. Omidira. Malyugin ring.  3.  Blepharitis is present - recommend regular lid cleaning.  4.  Asymptomatic, recommend observation for now. Findings, prognosis and treatment options reviewed.  5.  Observe; Artificial tears as needed for irritation.  6.  IOPs high normal today. OCT rNFL shows thinning OU 02/26/23 HVF 24-2 WNL OU 02/26/23 Recommend phaco PCIOL as above. Detailed discussion about glaucoma today including importance of maintaining good follow up and following treatment plan, and the possibility of irreversible blindness as part of this disease process.

## 2023-05-01 ENCOUNTER — Ambulatory Visit (HOSPITAL_COMMUNITY)
Admission: RE | Admit: 2023-05-01 | Discharge: 2023-05-01 | Disposition: A | Discharge status: Home / Self Care | Payer: PPO | Attending: Ophthalmology | Admitting: Ophthalmology

## 2023-05-01 ENCOUNTER — Encounter (HOSPITAL_COMMUNITY)
Admission: RE | Disposition: A | Discharge status: Home / Self Care | Payer: Self-pay | Source: Home / Self Care | Attending: Ophthalmology

## 2023-05-01 ENCOUNTER — Ambulatory Visit (HOSPITAL_COMMUNITY): Payer: PPO | Admitting: Certified Registered"

## 2023-05-01 ENCOUNTER — Encounter (HOSPITAL_COMMUNITY): Payer: Self-pay | Admitting: Ophthalmology

## 2023-05-01 DIAGNOSIS — H02831 Dermatochalasis of right upper eyelid: Secondary | ICD-10-CM | POA: Insufficient documentation

## 2023-05-01 DIAGNOSIS — I10 Essential (primary) hypertension: Secondary | ICD-10-CM | POA: Insufficient documentation

## 2023-05-01 DIAGNOSIS — H11153 Pinguecula, bilateral: Secondary | ICD-10-CM | POA: Insufficient documentation

## 2023-05-01 DIAGNOSIS — H2513 Age-related nuclear cataract, bilateral: Secondary | ICD-10-CM | POA: Insufficient documentation

## 2023-05-01 DIAGNOSIS — Z87891 Personal history of nicotine dependence: Secondary | ICD-10-CM | POA: Insufficient documentation

## 2023-05-01 DIAGNOSIS — H0100B Unspecified blepharitis left eye, upper and lower eyelids: Secondary | ICD-10-CM | POA: Insufficient documentation

## 2023-05-01 DIAGNOSIS — H40033 Anatomical narrow angle, bilateral: Secondary | ICD-10-CM | POA: Diagnosis not present

## 2023-05-01 DIAGNOSIS — Z7984 Long term (current) use of oral hypoglycemic drugs: Secondary | ICD-10-CM | POA: Diagnosis not present

## 2023-05-01 DIAGNOSIS — H402231 Chronic angle-closure glaucoma, bilateral, mild stage: Secondary | ICD-10-CM | POA: Diagnosis not present

## 2023-05-01 DIAGNOSIS — E1136 Type 2 diabetes mellitus with diabetic cataract: Secondary | ICD-10-CM | POA: Insufficient documentation

## 2023-05-01 DIAGNOSIS — H2512 Age-related nuclear cataract, left eye: Secondary | ICD-10-CM | POA: Insufficient documentation

## 2023-05-01 DIAGNOSIS — H02834 Dermatochalasis of left upper eyelid: Secondary | ICD-10-CM | POA: Diagnosis not present

## 2023-05-01 DIAGNOSIS — H0100A Unspecified blepharitis right eye, upper and lower eyelids: Secondary | ICD-10-CM | POA: Insufficient documentation

## 2023-05-01 DIAGNOSIS — I252 Old myocardial infarction: Secondary | ICD-10-CM | POA: Diagnosis not present

## 2023-05-01 HISTORY — PX: CATARACT EXTRACTION W/PHACO: SHX586

## 2023-05-01 LAB — GLUCOSE, CAPILLARY: Glucose-Capillary: 96 mg/dL (ref 70–99)

## 2023-05-01 SURGERY — PHACOEMULSIFICATION, CATARACT, WITH IOL INSERTION
Anesthesia: Monitor Anesthesia Care | Site: Eye | Laterality: Left

## 2023-05-01 MED ORDER — STERILE WATER FOR IRRIGATION IR SOLN
Status: DC | PRN
  Administered 2023-05-01: 1

## 2023-05-01 MED ORDER — SODIUM HYALURONATE 10 MG/ML IO SOLUTION
PREFILLED_SYRINGE | INTRAOCULAR | Status: DC | PRN
  Administered 2023-05-01: .85 mL via INTRAOCULAR

## 2023-05-01 MED ORDER — TETRACAINE HCL 0.5 % OP SOLN
1.0000 [drp] | OPHTHALMIC | Status: AC | PRN
  Administered 2023-05-01 (×3): 1 [drp] via OPHTHALMIC

## 2023-05-01 MED ORDER — PHENYLEPHRINE-KETOROLAC 1-0.3 % IO SOLN
INTRAOCULAR | Status: DC | PRN
  Administered 2023-05-01: 500 mL via OPHTHALMIC

## 2023-05-01 MED ORDER — SODIUM HYALURONATE 23MG/ML IO SOSY
PREFILLED_SYRINGE | INTRAOCULAR | Status: DC | PRN
  Administered 2023-05-01: .6 mL via INTRAOCULAR

## 2023-05-01 MED ORDER — MIDAZOLAM HCL 2 MG/2ML IJ SOLN
INTRAMUSCULAR | Status: AC
  Filled 2023-05-01: qty 2

## 2023-05-01 MED ORDER — TROPICAMIDE 1 % OP SOLN
1.0000 [drp] | OPHTHALMIC | Status: AC | PRN
  Administered 2023-05-01 (×3): 1 [drp] via OPHTHALMIC

## 2023-05-01 MED ORDER — LIDOCAINE HCL (PF) 1 % IJ SOLN
INTRAMUSCULAR | Status: AC
  Filled 2023-05-01: qty 2

## 2023-05-01 MED ORDER — MOXIFLOXACIN HCL 5 MG/ML IO SOLN
INTRAOCULAR | Status: DC | PRN
  Administered 2023-05-01: .2 mL via INTRACAMERAL

## 2023-05-01 MED ORDER — LIDOCAINE HCL 3.5 % OP GEL
1.0000 | Freq: Once | OPHTHALMIC | Status: AC
  Administered 2023-05-01: 1 via OPHTHALMIC

## 2023-05-01 MED ORDER — BSS IO SOLN
INTRAOCULAR | Status: DC | PRN
  Administered 2023-05-01: 15 mL via INTRAOCULAR

## 2023-05-01 MED ORDER — SODIUM CHLORIDE 0.9% FLUSH: 3.0000 mL | INTRAVENOUS | Status: DC | PRN

## 2023-05-01 MED ORDER — LIDOCAINE HCL (PF) 1 % IJ SOLN
INTRAOCULAR | Status: DC | PRN
  Administered 2023-05-01: 1 mL via OPHTHALMIC

## 2023-05-01 MED ORDER — MIDAZOLAM HCL 2 MG/2ML IJ SOLN
INTRAMUSCULAR | Status: DC | PRN
Start: 2023-05-01 — End: 2023-05-01
  Administered 2023-05-01: 1 mg via INTRAVENOUS

## 2023-05-01 MED ORDER — PHENYLEPHRINE HCL 2.5 % OP SOLN
1.0000 [drp] | OPHTHALMIC | Status: AC | PRN
  Administered 2023-05-01 (×3): 1 [drp] via OPHTHALMIC

## 2023-05-01 MED ORDER — SODIUM CHLORIDE 0.9% FLUSH
3.0000 mL | Freq: Two times a day (BID) | INTRAVENOUS | Status: DC
  Administered 2023-05-01: 8 mL via INTRAVENOUS

## 2023-05-01 MED ORDER — POVIDONE-IODINE 5 % OP SOLN
OPHTHALMIC | Status: DC | PRN
  Administered 2023-05-01: 1 via OPHTHALMIC

## 2023-05-01 SURGICAL SUPPLY — 12 items
CATARACT SUITE SIGHTPATH (MISCELLANEOUS) ×1
CLOTH BEACON ORANGE TIMEOUT ST (SAFETY) ×1 IMPLANT
EYE SHIELD UNIVERSAL CLEAR (GAUZE/BANDAGES/DRESSINGS) IMPLANT
FEE CATARACT SUITE SIGHTPATH (MISCELLANEOUS) ×1 IMPLANT
GLOVE BIOGEL PI IND STRL 7.0 (GLOVE) ×2 IMPLANT
LENS IOL TECNIS EYHANCE 24.0 (Intraocular Lens) IMPLANT
NDL HYPO 18GX1.5 BLUNT FILL (NEEDLE) ×1 IMPLANT
NEEDLE HYPO 18GX1.5 BLUNT FILL (NEEDLE) ×1
PAD ARMBOARD 7.5X6 YLW CONV (MISCELLANEOUS) ×1 IMPLANT
SYR TB 1ML LL NO SAFETY (SYRINGE) ×1 IMPLANT
TAPE SURG TRANSPORE 1 IN (GAUZE/BANDAGES/DRESSINGS) IMPLANT
WATER STERILE IRR 250ML POUR (IV SOLUTION) ×1 IMPLANT

## 2023-05-01 NOTE — Discharge Instructions (Signed)
 Please discharge patient when stable, will follow up today with Dr. June Leap at the [facility] office immediately following discharge.  Leave shield in place until visit.  All paperwork with discharge instructions will be given at the office.  [facility] Address:  [address]  [place], [place] [address]

## 2023-05-01 NOTE — Interval H&P Note (Signed)
 History and Physical Interval Note:  05/01/2023 12:29 PM  Stephen [name]  has presented today for surgery, with the diagnosis of age related nuclear cataract, left eye.  The various methods of treatment have been discussed with the patient and family. After consideration of risks, benefits and other options for treatment, the patient has consented to  Procedure(s): CATARACT EXTRACTION PHACO AND INTRAOCULAR LENS PLACEMENT (IOC) (Left) as a surgical intervention.  The patient's history has been reviewed, patient examined, no change in status, stable for surgery.  I have reviewed the patient's chart and labs.  Questions were answered to the patient's satisfaction.     HARRIE AGENT

## 2023-05-01 NOTE — Anesthesia Procedure Notes (Signed)
 Procedure Name: MAC Date/Time: 05/01/2023 12:32 PM  Performed by: Eliodoro Deward FALCON, CRNAPre-anesthesia Checklist: Patient identified, Emergency Drugs available, Suction available and Patient being monitored Patient Re-evaluated:Patient Re-evaluated prior to induction Oxygen Delivery Method: Nasal cannula Placement Confirmation: positive ETCO2

## 2023-05-01 NOTE — Anesthesia Preprocedure Evaluation (Signed)
 Anesthesia Evaluation  Patient identified by MRN, date of birth, ID band Patient awake    Reviewed: Allergy & Precautions, H&P , NPO status , Patient's Chart, lab work & pertinent test results, reviewed documented beta blocker date and time   History of Anesthesia Complications (+) PONV and history of anesthetic complications  Airway Mallampati: II  TM Distance: >3 FB Neck ROM: full    Dental no notable dental hx.    Pulmonary neg pulmonary ROS, former smoker   Pulmonary exam normal breath sounds clear to auscultation       Cardiovascular Exercise Tolerance: Good hypertension, + Past MI   Rhythm:regular Rate:Normal     Neuro/Psych negative neurological ROS  negative psych ROS   GI/Hepatic negative GI ROS, Neg liver ROS,,,  Endo/Other  diabetes    Renal/GU negative Renal ROS  negative genitourinary   Musculoskeletal   Abdominal   Peds  Hematology negative hematology ROS (+)   Anesthesia Other Findings   Reproductive/Obstetrics negative OB ROS                             Anesthesia Physical Anesthesia Plan  ASA: 2  Anesthesia Plan:    Post-op Pain Management:    Induction:   PONV Risk Score and Plan:   Airway Management Planned:   Additional Equipment:   Intra-op Plan:   Post-operative Plan:   Informed Consent: I have reviewed the patients History and Physical, chart, labs and discussed the procedure including the risks, benefits and alternatives for the proposed anesthesia with the patient or authorized representative who has indicated his/her understanding and acceptance.     Dental Advisory Given  Plan Discussed with: CRNA  Anesthesia Plan Comments:        Anesthesia Quick Evaluation

## 2023-05-01 NOTE — Transfer of Care (Signed)
 Immediate Anesthesia Transfer of Care Note  Patient: Stephen [name]  Procedure(s) Performed: CATARACT EXTRACTION PHACO AND INTRAOCULAR LENS PLACEMENT (IOC) (Left: Eye)  Patient Location: Short Stay  Anesthesia Type:MAC  Level of Consciousness: awake, alert , and oriented  Airway & Oxygen Therapy: Patient Spontanous Breathing  Post-op Assessment: Report given to RN and Post -op Vital signs reviewed and stable  Post vital signs: Reviewed and stable  Last Vitals:  Vitals Value Taken Time  BP    Temp    Pulse    Resp    SpO2      Last Pain:  Vitals:   05/01/23 1113  TempSrc: Oral  PainSc: 0-No pain         Complications: No notable events documented.

## 2023-05-01 NOTE — Op Note (Signed)
 Date of procedure: 05/01/23  Pre-operative diagnosis: Visually significant age-related nuclear cataract, Left Eye (H25.12)  Post-operative diagnosis: Visually significant age-related nuclear cataract, Left Eye  Procedure: Removal of cataract via phacoemulsification and insertion of intra-ocular lens Johnson and Johnson DIB00 +24.0D into the capsular bag of the Left Eye  Attending surgeon: Lynwood LABOR. Kert Shackett, MD, MA  Anesthesia: MAC, Topical Akten   Complications: None  Estimated Blood Loss: <50mL (minimal)  Specimens: None  Implants: As above  Indications:  Visually significant age-related cataract, Left Eye  Procedure:  The patient was seen and identified in the pre-operative area. The operative eye was identified and dilated.  The operative eye was marked.  Topical anesthesia was administered to the operative eye.     The patient was then to the operative suite and placed in the supine position.  A timeout was performed confirming the patient, procedure to be performed, and all other relevant information.   The patient's face was prepped and draped in the usual fashion for intra-ocular surgery.  A lid speculum was placed into the operative eye and the surgical microscope moved into place and focused.  An inferotemporal paracentesis was created using a 20 gauge paracentesis blade.  Shugarcaine was injected into the anterior chamber.  Viscoelastic was injected into the anterior chamber.  A temporal clear-corneal main wound incision was created using a 2.72mm microkeratome.  A continuous curvilinear capsulorrhexis was initiated using an irrigating cystitome and completed using capsulorrhexis forceps.  Hydrodissection and hydrodeliniation were performed.  Viscoelastic was injected into the anterior chamber.  A phacoemulsification handpiece and a chopper as a second instrument were used to remove the nucleus and epinucleus. The irrigation/aspiration handpiece was used to remove any remaining  cortical material.   The capsular bag was reinflated with viscoelastic, checked, and found to be intact.  The intraocular lens was inserted into the capsular bag.  The irrigation/aspiration handpiece was used to remove any remaining viscoelastic.  The clear corneal wound and paracentesis wounds were then hydrated and checked with Weck-Cels to be watertight. 0.1mL of moxifloxacin  was injected into the anterior chamber. The lid-speculum and drape was removed, and the patient's face was cleaned with a wet and dry 4x4. A clear shield was taped over the eye. The patient was taken to the post-operative care unit in good condition, having tolerated the procedure well.  Post-Op Instructions: The patient will follow up at [facility] for a same day post-operative evaluation and will receive all other orders and instructions.

## 2023-05-04 ENCOUNTER — Encounter (HOSPITAL_COMMUNITY): Payer: Self-pay | Admitting: Ophthalmology

## 2023-05-07 DIAGNOSIS — H2511 Age-related nuclear cataract, right eye: Secondary | ICD-10-CM | POA: Diagnosis not present

## 2023-05-08 ENCOUNTER — Encounter (HOSPITAL_COMMUNITY)
Admission: RE | Admit: 2023-05-08 | Discharge: 2023-05-08 | Disposition: A | Discharge status: Home / Self Care | Payer: Medicare HMO | Source: Ambulatory Visit | Attending: Ophthalmology | Admitting: Ophthalmology

## 2023-05-08 ENCOUNTER — Encounter (HOSPITAL_COMMUNITY): Payer: Self-pay

## 2023-05-08 NOTE — Anesthesia Postprocedure Evaluation (Signed)
 Anesthesia Post Note  Patient: Stephen [name]  Procedure(s) Performed: CATARACT EXTRACTION PHACO AND INTRAOCULAR LENS PLACEMENT (IOC) (Left: Eye)  Patient location during evaluation: Phase II Anesthesia Type: General Level of consciousness: awake Pain management: pain level controlled Vital Signs Assessment: post-procedure vital signs reviewed and stable Respiratory status: spontaneous breathing and respiratory function stable Cardiovascular status: blood pressure returned to baseline and stable Postop Assessment: no headache and no apparent nausea or vomiting Anesthetic complications: no Comments: Late entry   No notable events documented.   Last Vitals:  Vitals:   05/01/23 1113 05/01/23 1253  BP: (!) 155/105 (!) 150/96  Pulse: (!) 53 (!) 57  Resp: 15 16  Temp: 36.7 C 36.5 C  SpO2: 100% 100%    Last Pain:  Vitals:   05/04/23 0941  TempSrc:   PainSc: 0-No pain                 Yvonna JINNY Bosworth

## 2023-05-11 NOTE — H&P (Signed)
 Surgical History & Physical  Patient Name: Stephen [name]  DOB: 1955-11-12  Surgery: Cataract extraction with intraocular lens implant phacoemulsification; Right Eye Surgeon: Lynwood Hermann MD Surgery Date: 05/15/2023 Pre-Op Date: 05/07/2023  HPI: A 66 Yr. old male patient 1.  The patient is returning after cataract post-op. The left eye is affected. Status post cataract post-op, which began 6 days ago: Since the last visit, the affected area decreased. The patient's vision is improved. The condition's severity is constant. The complaint is associated with light sensitivity. Patient is following medication instructions. Patient states he is experiencing stars and lines in his vision. Also some lights sensitivity. Patient has trouble with fine print on his cell phone. This is negatively affecting the patient's quality of life and the patient is unable to function adequately in life with the current level of vision. HPI Completed by Dr. Lynwood Hermann  Medical History: Glaucoma  Diabetes Heart Problem High Blood Pressure  Review of Systems Negative Allergic/Immunologic Heart problems, hypertension Cardiovascular Negative Constitutional Negative Ear, Nose, Mouth & Throat Diabetes Endocrine Glaucoma, cataracts Eyes Negative Gastrointestinal Negative Genitourinary Negative Hemotologic/Lymphatic Negative Integumentary Negative Musculoskeletal Negative Neurological Negative Psychiatry Negative Respiratory  Social Never smoked   Medication Prednisolone-moxiflox-bromfen,  losartan  ,  metformin  ,  clopidogrel  ,  atorvastatin  ,  nitroglycerin  ,  metoprolol  succinate ,  Jardiance  ,  prednisolone acetate ,  Ilevro ,  moxifloxacin    Sx/Procedures Phaco c IOL OS,  Hernia Sx, Prostate CA surgery  Drug Allergies  NKDA  History & Physical: Heent: cataracts NECK: supple without bruits LUNGS: lungs clear to auscultation CV: regular rate and rhythm Abdomen: soft and non-tender  Impression  & Plan: Assessment: 1.  CATARACT EXTRACTION STATUS; Left Eye (Z98.42) 2.  NUCLEAR SCLEROSIS AGE RELATED; Both Eyes (H25.13) 3.  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  Hyperopia (H52.03)  Plan: 1.  2 weeks after cataract surgery. Doing well with improved vision. Call with any problems or concerns. Continue Pred-Moxi-Brom 2x/day for 3 more weeks. IOP elevated - likely steroid responder - start Combigan 1 drop every 12 hours left eye.  2.  Cataract accounts for the patient's decreased vision. This visual impairment is not correctable with a tolerable change in glasses or contact lenses. Cataract surgery with an implantation of a new lens should significantly improve the visual and functional status of the patient. Discussed all risks, benefits, alternatives, and potential complications. Discussed the procedures and recovery. Patient desires to have surgery. A-scan ordered and performed today for intra-ocular lens calculations. The surgery will be performed in order to improve vision for driving, reading, and for eye examinations. Recommend phacoemulsification with intra-ocular lens. Recommend Dextenza  for post-operative pain and inflammation. Right Eye. Surgery required to correct imbalance of vision. Could not dilated OD because of occludable angles.  3.  Worse left eye. 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 Recommend phaco PCIOL in both eyes to relieve risk of acute angle closure attack.  4. Continue care with Dr. Darroll

## 2023-05-14 NOTE — Anesthesia Preprocedure Evaluation (Addendum)
Anesthesia Evaluation  Patient identified by MRN, date of birth, ID band Patient awake    Reviewed: Allergy & Precautions, H&P , NPO status , Patient's Chart, lab work & pertinent test results, reviewed documented beta blocker date and time   History of Anesthesia Complications (+) PONV and history of anesthetic complications  Airway Mallampati: II  TM Distance: >3 FB Neck ROM: full    Dental  (+) Partial Upper, Dental Advisory Given   Pulmonary former smoker   Pulmonary exam normal breath sounds clear to auscultation       Cardiovascular Exercise Tolerance: Good hypertension, + Past MI   Rhythm:regular Rate:Normal     Neuro/Psych negative neurological ROS  negative psych ROS   GI/Hepatic negative GI ROS, Neg liver ROS,,,  Endo/Other  diabetes    Renal/GU negative Renal ROS  negative genitourinary   Musculoskeletal   Abdominal Normal abdominal exam  (+)   Peds  Hematology negative hematology ROS (+)   Anesthesia Other Findings   Reproductive/Obstetrics negative OB ROS                             Anesthesia Physical Anesthesia Plan  ASA: 3  Anesthesia Plan: MAC   Post-op Pain Management: Minimal or no pain anticipated   Induction:   PONV Risk Score and Plan:   Airway Management Planned: Nasal Cannula and Natural Airway  Additional Equipment: None  Intra-op Plan:   Post-operative Plan:   Informed Consent: I have reviewed the patients History and Physical, chart, labs and discussed the procedure including the risks, benefits and alternatives for the proposed anesthesia with the patient or authorized representative who has indicated his/her understanding and acceptance.     Dental Advisory Given  Plan Discussed with: CRNA  Anesthesia Plan Comments:        Anesthesia Quick Evaluation

## 2023-05-15 ENCOUNTER — Ambulatory Visit (HOSPITAL_BASED_OUTPATIENT_CLINIC_OR_DEPARTMENT_OTHER): Payer: PPO | Admitting: Anesthesiology

## 2023-05-15 ENCOUNTER — Ambulatory Visit (HOSPITAL_COMMUNITY)
Admission: RE | Admit: 2023-05-15 | Discharge: 2023-05-15 | Disposition: A | Discharge status: Home / Self Care | Payer: PPO | Attending: Ophthalmology | Admitting: Ophthalmology

## 2023-05-15 ENCOUNTER — Ambulatory Visit (HOSPITAL_COMMUNITY): Payer: PPO | Admitting: Anesthesiology

## 2023-05-15 ENCOUNTER — Encounter (HOSPITAL_COMMUNITY): Payer: Self-pay | Admitting: Ophthalmology

## 2023-05-15 ENCOUNTER — Encounter (HOSPITAL_COMMUNITY)
Admission: RE | Disposition: A | Discharge status: Home / Self Care | Payer: Self-pay | Source: Home / Self Care | Attending: Ophthalmology

## 2023-05-15 ENCOUNTER — Other Ambulatory Visit: Payer: Self-pay

## 2023-05-15 DIAGNOSIS — E1136 Type 2 diabetes mellitus with diabetic cataract: Secondary | ICD-10-CM | POA: Insufficient documentation

## 2023-05-15 DIAGNOSIS — I10 Essential (primary) hypertension: Secondary | ICD-10-CM | POA: Insufficient documentation

## 2023-05-15 DIAGNOSIS — Z9842 Cataract extraction status, left eye: Secondary | ICD-10-CM | POA: Insufficient documentation

## 2023-05-15 DIAGNOSIS — E119 Type 2 diabetes mellitus without complications: Secondary | ICD-10-CM | POA: Diagnosis not present

## 2023-05-15 DIAGNOSIS — I252 Old myocardial infarction: Secondary | ICD-10-CM | POA: Diagnosis not present

## 2023-05-15 DIAGNOSIS — H40033 Anatomical narrow angle, bilateral: Secondary | ICD-10-CM | POA: Insufficient documentation

## 2023-05-15 DIAGNOSIS — H5200 Hypermetropia, unspecified eye: Secondary | ICD-10-CM | POA: Insufficient documentation

## 2023-05-15 DIAGNOSIS — H2511 Age-related nuclear cataract, right eye: Secondary | ICD-10-CM | POA: Diagnosis not present

## 2023-05-15 HISTORY — PX: CATARACT EXTRACTION W/PHACO: SHX586

## 2023-05-15 LAB — GLUCOSE, CAPILLARY: Glucose-Capillary: 105 mg/dL — ABNORMAL HIGH (ref 70–99)

## 2023-05-15 SURGERY — PHACOEMULSIFICATION, CATARACT, WITH IOL INSERTION
Anesthesia: Monitor Anesthesia Care | Site: Eye | Laterality: Right

## 2023-05-15 MED ORDER — LIDOCAINE HCL 3.5 % OP GEL
1.0000 | Freq: Once | OPHTHALMIC | Status: AC
  Administered 2023-05-15: 1 via OPHTHALMIC

## 2023-05-15 MED ORDER — LIDOCAINE HCL (PF) 1 % IJ SOLN
INTRAOCULAR | Status: DC | PRN
  Administered 2023-05-15: 1 mL via OPHTHALMIC

## 2023-05-15 MED ORDER — STERILE WATER FOR IRRIGATION IR SOLN
Status: DC | PRN
  Administered 2023-05-15: 250 mL

## 2023-05-15 MED ORDER — TROPICAMIDE 1 % OP SOLN
1.0000 [drp] | OPHTHALMIC | Status: AC | PRN
  Administered 2023-05-15 (×3): 1 [drp] via OPHTHALMIC

## 2023-05-15 MED ORDER — MIDAZOLAM HCL 2 MG/2ML IJ SOLN
INTRAMUSCULAR | Status: DC | PRN
  Administered 2023-05-15: 1 mg via INTRAVENOUS

## 2023-05-15 MED ORDER — BSS IO SOLN
INTRAOCULAR | Status: DC | PRN
  Administered 2023-05-15: 15 mL via INTRAOCULAR

## 2023-05-15 MED ORDER — SODIUM HYALURONATE 23MG/ML IO SOSY
PREFILLED_SYRINGE | INTRAOCULAR | Status: DC | PRN
  Administered 2023-05-15: .6 mL via INTRAOCULAR

## 2023-05-15 MED ORDER — TETRACAINE HCL 0.5 % OP SOLN
1.0000 [drp] | OPHTHALMIC | Status: AC | PRN
Start: 2023-05-15 — End: 2023-05-15
  Administered 2023-05-15 (×3): 1 [drp] via OPHTHALMIC

## 2023-05-15 MED ORDER — MOXIFLOXACIN HCL 5 MG/ML IO SOLN
INTRAOCULAR | Status: DC | PRN
  Administered 2023-05-15: .2 mL via INTRACAMERAL

## 2023-05-15 MED ORDER — MIDAZOLAM HCL 2 MG/2ML IJ SOLN
INTRAMUSCULAR | Status: AC
  Filled 2023-05-15: qty 2

## 2023-05-15 MED ORDER — POVIDONE-IODINE 5 % OP SOLN
OPHTHALMIC | Status: DC | PRN
  Administered 2023-05-15: 1 via OPHTHALMIC

## 2023-05-15 MED ORDER — SODIUM CHLORIDE 0.9% FLUSH
3.0000 mL | Freq: Two times a day (BID) | INTRAVENOUS | Status: DC
Start: 2023-05-15 — End: 2023-05-15
  Administered 2023-05-15: 7 mL via INTRAVENOUS

## 2023-05-15 MED ORDER — SODIUM HYALURONATE 10 MG/ML IO SOLUTION
PREFILLED_SYRINGE | INTRAOCULAR | Status: DC | PRN
  Administered 2023-05-15: .85 mL via INTRAOCULAR

## 2023-05-15 MED ORDER — PHENYLEPHRINE HCL 2.5 % OP SOLN
1.0000 [drp] | OPHTHALMIC | Status: AC | PRN
Start: 2023-05-15 — End: 2023-05-15
  Administered 2023-05-15 (×3): 1 [drp] via OPHTHALMIC

## 2023-05-15 MED ORDER — SODIUM CHLORIDE 0.9% FLUSH
3.0000 mL | INTRAVENOUS | Status: DC | PRN
Start: 2023-05-15 — End: 2023-05-15

## 2023-05-15 MED ORDER — PHENYLEPHRINE-KETOROLAC 1-0.3 % IO SOLN
INTRAOCULAR | Status: DC | PRN
  Administered 2023-05-15: 500 mL via OPHTHALMIC

## 2023-05-15 SURGICAL SUPPLY — 12 items
CATARACT SUITE SIGHTPATH (MISCELLANEOUS) ×1
CLOTH BEACON ORANGE TIMEOUT ST (SAFETY) ×1 IMPLANT
EYE SHIELD UNIVERSAL CLEAR (GAUZE/BANDAGES/DRESSINGS) IMPLANT
FEE CATARACT SUITE SIGHTPATH (MISCELLANEOUS) ×1 IMPLANT
GLOVE BIOGEL PI IND STRL 7.0 (GLOVE) ×2 IMPLANT
LENS IOL TECNIS EYHANCE 28.5 (Intraocular Lens) IMPLANT
NDL HYPO 18GX1.5 BLUNT FILL (NEEDLE) ×1 IMPLANT
NEEDLE HYPO 18GX1.5 BLUNT FILL (NEEDLE) ×1
PAD ARMBOARD 7.5X6 YLW CONV (MISCELLANEOUS) ×1 IMPLANT
SYR TB 1ML LL NO SAFETY (SYRINGE) ×1 IMPLANT
TAPE SURG TRANSPORE 1 IN (GAUZE/BANDAGES/DRESSINGS) IMPLANT
WATER STERILE IRR 250ML POUR (IV SOLUTION) ×1 IMPLANT

## 2023-05-15 NOTE — Transfer of Care (Signed)
Immediate Anesthesia Transfer of Care Note  Patient: Stephen [name]  Procedure(s) Performed: CATARACT EXTRACTION PHACO AND INTRAOCULAR LENS PLACEMENT (IOC) (Right: Eye)  Patient Location: Short Stay  Anesthesia Type:MAC  Level of Consciousness: awake, alert , and oriented  Airway & Oxygen Therapy: Patient Spontanous Breathing  Post-op Assessment: Report given to RN and Post -op Vital signs reviewed and stable  Post vital signs: Reviewed and stable  Last Vitals:  Vitals Value Taken Time  BP    Temp    Pulse    Resp    SpO2      Last Pain:  Vitals:   05/15/23 0754  TempSrc: Oral  PainSc: 0-No pain      Patients Stated Pain Goal: 3 (05/15/23 0754)  Complications: No notable events documented.

## 2023-05-15 NOTE — Anesthesia Postprocedure Evaluation (Signed)
Anesthesia Post Note  Patient: [name]  Procedure(s) Performed: CATARACT EXTRACTION PHACO AND INTRAOCULAR LENS PLACEMENT (IOC) (Right: Eye)  Patient location during evaluation: PACU Anesthesia Type: MAC Level of consciousness: awake and alert Pain management: pain level controlled Vital Signs Assessment: post-procedure vital signs reviewed and stable Respiratory status: spontaneous breathing, nonlabored ventilation, respiratory function stable and patient connected to nasal cannula oxygen Cardiovascular status: stable and blood pressure returned to baseline Postop Assessment: no apparent nausea or vomiting Anesthetic complications: no   There were no known notable events for this encounter.   Last Vitals:  Vitals:   05/15/23 0844 05/15/23 0850  BP:  (!) 153/86  Pulse: (!) 55   Resp: 15   Temp: 36.6 C   SpO2: 100%     Last Pain:  Vitals:   05/15/23 0844  TempSrc: Oral  PainSc: 0-No pain                 Shean Gerding L Margarita Croke

## 2023-05-15 NOTE — Anesthesia Procedure Notes (Signed)
Procedure Name: MAC Date/Time: 05/15/2023 8:24 AM  Performed by: Julian Reil, CRNAPre-anesthesia Checklist: Patient identified, Emergency Drugs available, Suction available and Patient being monitored Patient Re-evaluated:Patient Re-evaluated prior to induction Oxygen Delivery Method: Nasal cannula Placement Confirmation: positive ETCO2

## 2023-05-15 NOTE — Op Note (Signed)
Date of procedure: 05/15/23  Pre-operative diagnosis: Visually significant age-related nuclear cataract, Right Eye (H25.11)  Post-operative diagnosis: Visually significant age-related nuclear cataract, Right Eye  Procedure: Removal of cataract via phacoemulsification and insertion of intra-ocular lens Laural Benes and Johnson DIB00 +28.5D into the capsular bag of the Right Eye  Attending surgeon: Rudy Jew. Maclean Foister, MD, MA  Anesthesia: MAC, Topical Akten  Complications: None  Estimated Blood Loss: <54mL (minimal)  Specimens: None  Implants: As above  Indications:  Visually significant age-related cataract, Right Eye  Procedure:  The patient was seen and identified in the pre-operative area. The operative eye was identified and dilated.  The operative eye was marked.  Topical anesthesia was administered to the operative eye.     The patient was then to the operative suite and placed in the supine position.  A timeout was performed confirming the patient, procedure to be performed, and all other relevant information.   The patient's face was prepped and draped in the usual fashion for intra-ocular surgery.  A lid speculum was placed into the operative eye and the surgical microscope moved into place and focused.  A superotemporal paracentesis was created using a 20 gauge paracentesis blade.  Shugarcaine was injected into the anterior chamber.  Viscoelastic was injected into the anterior chamber.  A temporal clear-corneal main wound incision was created using a 2.64mm microkeratome.  A continuous curvilinear capsulorrhexis was initiated using an irrigating cystitome and completed using capsulorrhexis forceps.  Hydrodissection and hydrodeliniation were performed.  Viscoelastic was injected into the anterior chamber.  A phacoemulsification handpiece and a chopper as a second instrument were used to remove the nucleus and epinucleus. The irrigation/aspiration handpiece was used to remove any remaining  cortical material.   The capsular bag was reinflated with viscoelastic, checked, and found to be intact.  The intraocular lens was inserted into the capsular bag.  The irrigation/aspiration handpiece was used to remove any remaining viscoelastic.  The clear corneal wound and paracentesis wounds were then hydrated and checked with Weck-Cels to be watertight. 0.75mL of moxifloxacin was injected into the anterior chamber. The lid-speculum and drape was removed, and the patient's face was cleaned with a wet and dry 4x4.  A clear shield was taped over the eye. The patient was taken to the post-operative care unit in good condition, having tolerated the procedure well.  Post-Op Instructions: The patient will follow up at [facility] for a same day post-operative evaluation and will receive all other orders and instructions.

## 2023-05-15 NOTE — Interval H&P Note (Signed)
History and Physical Interval Note:  05/15/2023 8:18 AM  Stephen [name] Plan  has presented today for surgery, with the diagnosis of age related nuclear cataract, right eye.  The various methods of treatment have been discussed with the patient and family. After consideration of risks, benefits and other options for treatment, the patient has consented to  Procedure(s) with comments: CATARACT EXTRACTION PHACO AND INTRAOCULAR LENS PLACEMENT (IOC) (Right) - CDE: as a surgical intervention.  The patient's history has been reviewed, patient examined, no change in status, stable for surgery.  I have reviewed the patient's chart and labs.  Questions were answered to the patient's satisfaction.     Stephen [name]

## 2023-05-15 NOTE — Discharge Instructions (Signed)
Please discharge patient when stable, will follow up today with Dr. Carnella Fryman at the [facility] office immediately following discharge.  Leave shield in place until visit.  All paperwork with discharge instructions will be given at the office.  [facility] Address:  [address]  [place], [place] [address]  

## 2023-05-18 ENCOUNTER — Encounter (HOSPITAL_COMMUNITY): Payer: Self-pay | Admitting: Ophthalmology

## 2023-05-31 NOTE — Progress Notes (Signed)
 Cardiology Office Note:  .   Date:  06/02/2023  ID:  Stephen [name], DOB Nov 14, 1955, MRN [id number] PCP: Ransom Other, MD  [facility] Providers Cardiologist:  Darryle ONEIDA Decent, MD { History of Present Illness: .   Stephen [name] is a 68 y.o. male with history of CAD who presents for follow-up.   History of Present Illness   Stephen [name] is a 68 year old male with non-STEMI who presents for follow-up.  He has a history of non-STEMI in May 2024, status post percutaneous coronary intervention (PCI) through the mid left anterior descending (AD) and obtuse marginal 1 (OM1) arteries. He presents for a follow-up visit with no current symptoms of angina, chest pain, or trouble breathing. He experienced shortness of breath after the initial heart attack but not before. He retired shortly after his stress test, having worked for three weeks before deciding to retire upon turning 6.  He engages in regular physical activity, using a stationary bike daily, covering at least five miles per session without experiencing discomfort, trouble breathing, or weakness. He monitors his blood pressure at home, noting that the systolic pressure is often around 130-135 mmHg. No swelling in his legs is reported.  He is currently on losartan  50 mg, aspirin , clopidogrel  (Plavix ), and Lipitor . His cholesterol levels are well-controlled with an LDL of 51 mg/dL, and his diabetes is managed with an A1c of 6.4%.  In terms of diet, he eats grilled chicken with vegetables every other day, including green beans, peas, and pinto beans. He occasionally consumes beef and fish, with fish being eaten about once a week.          Problem List NSTEMI -PCI to mid LAD and OM1 (09/14/2022) 2. HLD -T chol 115, HDL 48, LDL 51, TG 85 3. HTN 4. DM -A1c 6.4    ROS: All other ROS reviewed and negative. Pertinent positives noted in the HPI.     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 The study is intermediate risk.   No ST deviation was noted.   LV perfusion is abnormal. Defect 1: There is a medium defect with moderate reduction in uptake present in the mid to basal inferolateral location(s) that is fixed. There is abnormal wall motion in the defect area. Consistent with infarction.   Left ventricular function is abnormal. There was a single regional abnormality. Nuclear stress EF: 41%. The left ventricular ejection fraction is moderately decreased (30-44%). End diastolic cavity size is normal. End systolic cavity size is normal.  LHC 09/14/2022 1.  Severe two-vessel coronary artery disease with total occlusion of the first OM branch of the circumflex and severe stenosis of the proximal to mid LAD, both lesions treated with PCI using a single drug-eluting stent at each lesion site 2.  Patent RCA with mild nonobstructive disease 3.  Normal LVEF by echo assessment with no regional wall motion abnormalities, normal LVEDP at cardiac catheterization  TTE 09/14/2022  1. Left ventricular ejection fraction, by estimation, is  60 to 65%. The  left ventricle has normal function. The left ventricle has no regional  wall motion abnormalities. There is moderate concentric left ventricular  hypertrophy. Left ventricular  diastolic parameters are consistent with Grade I diastolic dysfunction  (impaired relaxation).   2. Right ventricular systolic function is normal. The right ventricular  size is normal. Tricuspid regurgitation signal is  inadequate for assessing  PA pressure.   3. The mitral valve is normal in structure. No evidence of mitral valve  regurgitation.   4. The aortic valve is tricuspid. Aortic valve regurgitation is not  visualized.   5. The inferior vena cava is normal in size with greater than 50%  respiratory variability, suggesting right atrial pressure of 3 mmHg.   Physical Exam:   VS:  BP (!) 142/78 (BP Location: Right Arm, Patient Position: Sitting)   Pulse 67   Ht 5' 8 (1.727 m)   Wt 192 lb 6.4 oz (87.3 kg)   SpO2 97%   BMI 29.25 kg/m    Wt Readings from Last 3 Encounters:  06/02/23 192 lb 6.4 oz (87.3 kg)  05/15/23 179 lb 14.3 oz (81.6 kg)  05/08/23 180 lb (81.6 kg)    GEN: Well nourished, well developed in no acute distress NECK: No JVD; No carotid bruits CARDIAC: RRR, no murmurs, rubs, gallops RESPIRATORY:  Clear to auscultation without rales, wheezing or rhonchi  ABDOMEN: Soft, non-tender, non-distended EXTREMITIES:  No edema; No deformity  ASSESSMENT AND PLAN: .   Assessment and Plan    Coronary Artery Disease (CAD) History of non-STEMI in May 2024, status post PCI to mid-LAD and OM1. No current symptoms of angina. EKG unchanged from prior, showing inferolateral T wave inversions, inferior Q waves, and R waves in V1, likely representative of inferior posterior MI. -no angina -ok to continue BB for BP control  -Continue aspirin  and Plavix  until Sep 14, 2023, then discontinue Plavix  and continue aspirin  indefinitely. -Continue Lipitor  80mg  daily.  Hypertension Blood pressure slightly elevated at 142/78. -Increase losartan  to 100mg  daily. -continue metoprolol  succinate 25 mg daily  -Monitor blood pressure at home.  Diabetes Well-controlled with recent A1C of 6.4. -Continue current management.  General Health Maintenance / Followup Plans -Continue daily exercise on stationary bike. -Return for follow-up in 6 months.              Follow-up: Return in about 6 months  (around 11/30/2023).   Signed, Darryle DASEN. Barbaraann, MD, [facility]  [facility]  [address] [place], [place] [address] [phone]  10:06 AM

## 2023-06-02 ENCOUNTER — Ambulatory Visit: Payer: PPO | Attending: Cardiovascular Disease | Admitting: Cardiovascular Disease

## 2023-06-02 ENCOUNTER — Encounter: Payer: Self-pay | Admitting: Cardiovascular Disease

## 2023-06-02 VITALS — BP 142/78 | HR 67 | Ht 68.0 in | Wt 192.4 lb

## 2023-06-02 DIAGNOSIS — E785 Hyperlipidemia, unspecified: Secondary | ICD-10-CM

## 2023-06-02 DIAGNOSIS — I10 Essential (primary) hypertension: Secondary | ICD-10-CM

## 2023-06-02 DIAGNOSIS — I2510 Atherosclerotic heart disease of native coronary artery without angina pectoris: Secondary | ICD-10-CM

## 2023-06-02 MED ORDER — LOSARTAN POTASSIUM 100 MG PO TABS: 100.0000 mg | ORAL_TABLET | Freq: Every day | ORAL | 3 refills | Status: DC

## 2023-06-02 NOTE — Patient Instructions (Signed)
 Medication Instructions:  - STOP Plavix  on Sep 14, 2023. Continue ASPIRIN .  - Increase LOSARTAN  TO 100MG  DAILY    *If you need a refill on your cardiac medications before your next appointment, please call your pharmacy*   Lab Work: NONE    If you have labs (blood work) drawn today and your tests are completely normal, you will receive your results only by: MyChart Message (if you have MyChart) OR A paper copy in the mail If you have any lab test that is abnormal or we need to change your treatment, we will call you to review the results.   Testing/Procedures: NONE    Follow-Up: At [facility], you and your health needs are our priority.  As part of our continuing mission to provide you with exceptional heart care, we have created designated Provider Care Teams.  These Care Teams include your primary Cardiologist (physician) and Advanced Practice Providers (APPs -  Physician Assistants and Nurse Practitioners) who all work together to provide you with the care you need, when you need it.  We recommend signing up for the patient portal called MyChart.  Sign up information is provided on this After Visit Summary.  MyChart is used to connect with patients for Virtual Visits (Telemedicine).  Patients are able to view lab/test results, encounter notes, upcoming appointments, etc.  Non-urgent messages can be sent to your provider as well.   To learn more about what you can do with MyChart, go to [email or web address].    Your next appointment:   6 month(s)  The format for your next appointment:   In Person  Provider:   Josefa Beauvais, FNP, Jon Hails, PA-C, Callie Goodrich, PA-C, Kathleen Johnson, PA-C, Delon Holts, PA-C, Lamarr Satterfield, DNP, ANP, Hao Meng, PA-C, or Damien Braver, NP    Then, Darryle ONEIDA Decent, MD will plan to see you again in 1 year(s).   Other Instructions

## 2023-06-19 ENCOUNTER — Ambulatory Visit
Admission: EM | Admit: 2023-06-19 | Discharge: 2023-06-19 | Disposition: A | Discharge status: Home / Self Care | Payer: PPO | Attending: Family Medicine | Admitting: Family Medicine

## 2023-06-19 DIAGNOSIS — S39012A Strain of muscle, fascia and tendon of lower back, initial encounter: Secondary | ICD-10-CM | POA: Diagnosis not present

## 2023-06-19 MED ORDER — KETOROLAC TROMETHAMINE 30 MG/ML IJ SOLN
30.0000 mg | Freq: Once | INTRAMUSCULAR | Status: AC
  Administered 2023-06-19: 30 mg via INTRAMUSCULAR

## 2023-06-19 MED ORDER — TIZANIDINE HCL 4 MG PO CAPS: 4.0000 mg | ORAL_CAPSULE | Freq: Three times a day (TID) | ORAL | 0 refills | Status: AC | PRN

## 2023-06-19 NOTE — ED Triage Notes (Signed)
Pt reports he has lower back pain x  4 days   Denies urinary sxs.

## 2023-06-19 NOTE — ED Provider Notes (Signed)
[facility]    CSN: [id number] Arrival date & time: 06/19/23  1257      History   Chief Complaint No chief complaint on file.   HPI Stephen [name] is a 68 y.o. male.   Patient presenting today with 4-day history of mid low back pain worse with movement.  Denies any known injury to the area, radiation of pain down legs, bowel or bladder incontinence, saddle anesthesias, urinary symptoms.  States this happens about once a year for him and usually he can get it to work its way out with exercise but that has not worked this time.  Also taking Tylenol with minimal relief.    Past Medical History:  Diagnosis Date   Cancer (HCC) 04/28/2006   prostate   Diabetes mellitus without complication (HCC)    History of inguinal hernia repair    right   Hyperlipidemia    Hypertension    PONV (postoperative nausea and vomiting)     Patient Active Problem List   Diagnosis Date Noted   Hypertension 09/14/2022   Type 2 diabetes mellitus with complication, without long-term current use of insulin (HCC) 09/14/2022   NSTEMI (non-ST elevated myocardial infarction) (HCC) 09/13/2022   Right inguinal hernia 03/12/2011    Past Surgical History:  Procedure Laterality Date   CATARACT EXTRACTION W/PHACO Left 05/01/2023   Procedure: CATARACT EXTRACTION PHACO AND INTRAOCULAR LENS PLACEMENT (IOC);  Surgeon: Fabio Pierce, MD;  Location: [facility];  Service: Ophthalmology;  Laterality: Left;  CDE 5.85   CATARACT EXTRACTION W/PHACO Right 05/15/2023   Procedure: CATARACT EXTRACTION PHACO AND INTRAOCULAR LENS PLACEMENT (IOC);  Surgeon: Fabio Pierce, MD;  Location: [facility];  Service: Ophthalmology;  Laterality: Right;  CDE: 5.77   CORONARY/GRAFT ACUTE MI REVASCULARIZATION N/A 09/14/2022   Procedure: Coronary/Graft Acute MI Revascularization;  Surgeon: Tonny Bollman, MD;  Location: [facility] INVASIVE CV LAB;  Service: Cardiovascular;  Laterality: N/A;   HEMORRHOID SURGERY     HERNIA REPAIR     INGUINAL  HERNIA REPAIR  03/28/2011   Procedure: HERNIA REPAIR INGUINAL ADULT;  Surgeon: Robyne Askew, MD;  Location: [facility];  Service: General;  Laterality: Right;   LEFT HEART CATH AND CORONARY ANGIOGRAPHY N/A 09/14/2022   Procedure: LEFT HEART CATH AND CORONARY ANGIOGRAPHY;  Surgeon: Tonny Bollman, MD;  Location: [facility] INVASIVE CV LAB;  Service: Cardiovascular;  Laterality: N/A;   PROSTATE SURGERY  02/17/07       Home Medications    Prior to Admission medications   Medication Sig Start Date End Date Taking? Authorizing Provider  tiZANidine (ZANAFLEX) 4 MG capsule Take 1 capsule (4 mg total) by mouth 3 (three) times daily as needed for muscle spasms. Do not drink alcohol or drive while taking this medication.  May cause drowsiness. 06/19/23  Yes Particia Nearing, PA-C  aspirin EC 81 MG tablet Take 1 tablet (81 mg total) by mouth daily. Swallow whole. 09/16/22   Hermelinda Dellen, MD  atorvastatin (LIPITOR) 80 MG tablet Take 1 tablet (80 mg total) by mouth daily. 09/15/22   Hermelinda Dellen, MD  clopidogrel (PLAVIX) 75 MG tablet Take 1 tablet (75 mg total) by mouth daily. Take 4 tablets (300 mg) the first day. Then each day after take 1 tablet (75 mg) daily 10/16/22 10/19/23  O'Neal, Ronnald Ramp, MD  doxycycline (VIBRA-TABS) 100 MG tablet Take 100 mg by mouth 2 (two) times daily. Patient not taking: Reported on 06/02/2023 03/11/23   [provider]  empagliflozin (JARDIANCE) 10 MG TABS tablet Take 1 tablet (10 mg total) by mouth daily before breakfast. 09/15/22   Hermelinda Dellen, MD  esomeprazole (NEXIUM) 20 MG capsule Take 20 mg by mouth as needed.    [provider]  Glucosamine Sulfate 1000 MG CAPS Take 2 capsules by mouth daily.    [provider]  ILEVRO 0.3 % ophthalmic suspension Place 1 drop into both eyes daily. 04/20/23   [provider]  losartan (COZAAR) 100 MG tablet Take 1 tablet (100 mg total) by mouth daily. 06/02/23 08/31/23  Sande Rives, MD  metFORMIN (GLUCOPHAGE) 500 MG tablet Take 1 tablet (500 mg total) by mouth daily. 09/17/22   Hermelinda Dellen, MD  metoprolol succinate (TOPROL XL) 25 MG 24 hr tablet Take 1 tablet (25 mg total) by mouth daily. 09/15/22 09/15/23  Hermelinda Dellen, MD  moxifloxacin (VIGAMOX) 0.5 % ophthalmic solution Place 1 drop into both eyes 3 (three) times daily. 04/20/23   [provider]  nitroGLYCERIN (NITROSTAT) 0.4 MG SL tablet Place 1 tablet (0.4 mg total) under the tongue every 5 (five) minutes x 3 doses as needed for chest pain. 09/15/22   Hermelinda Dellen, MD  polyethylene glycol powder [facility]) 17 GM/SCOOP powder Take 0.5 Containers by mouth as needed for moderate constipation.    [provider]  prednisoLONE acetate (PRED FORTE) 1 % ophthalmic suspension Place 1 drop into both eyes 4 (four) times daily. 04/20/23   [provider]    Family History Family History  Problem Relation Age of Onset   Diabetes Father    Heart disease Father     Social History Social History   Tobacco Use   Smoking status: Former   Smokeless tobacco: Never  Substance Use Topics   Alcohol use: No   Drug use: No     Allergies   Patient has no known allergies.   Review of Systems Review of Systems Per HPI  Physical Exam Triage Vital Signs ED Triage Vitals  Encounter Vitals Group     BP 06/19/23 1320 (!) 151/87     Systolic BP Percentile --      Diastolic BP Percentile --      Pulse Rate 06/19/23 1320 79     Resp 06/19/23 1320 18     Temp 06/19/23 1320 98.2 F (36.8 C)     Temp Source 06/19/23 1320 Oral     SpO2 06/19/23 1320 97 %     Weight --      Height --      Head Circumference --      Peak Flow --      Pain Score 06/19/23 1322 6     Pain Loc --      Pain Education --      Exclude from Growth Chart --    No data found.  Updated Vital Signs BP (!) 151/87 (BP Location: Right Arm)   Pulse 79   Temp 98.2 F (36.8 C) (Oral)   Resp 18    SpO2 97%   Visual Acuity Right Eye Distance:   Left Eye Distance:   Bilateral Distance:    Right Eye Near:   Left Eye Near:    Bilateral Near:     Physical Exam Vitals and nursing note reviewed.  Constitutional:      Appearance: Normal appearance.  HENT:     Head: Atraumatic.  Eyes:     Extraocular Movements: Extraocular movements intact.  Conjunctiva/sclera: Conjunctivae normal.  Cardiovascular:     Rate and Rhythm: Normal rate and regular rhythm.  Pulmonary:     Effort: Pulmonary effort is normal.     Breath sounds: Normal breath sounds.  Musculoskeletal:        General: Tenderness present. No swelling or deformity. Normal range of motion.     Cervical back: Normal range of motion and neck supple.     Comments: Bilateral lumbar paraspinal muscles tender to palpation.  Negative straight leg raise bilateral lower extremities.  No midline spinal tenderness to palpation diffusely  Skin:    General: Skin is warm and dry.     Findings: No bruising or erythema.  Neurological:     General: No focal deficit present.     Mental Status: He is oriented to person, place, and time.     Motor: No weakness.     Gait: Gait normal.     Comments: Bilateral lower extremities neurovascularly intact  Psychiatric:        Mood and Affect: Mood normal.        Thought Content: Thought content normal.        Judgment: Judgment normal.      UC Treatments / Results  Labs (all labs ordered are listed, but only abnormal results are displayed) Labs Reviewed - No data to display  EKG   Radiology No results found.  Procedures Procedures (including critical care time)  Medications Ordered in UC Medications  ketorolac (TORADOL) 30 MG/ML injection 30 mg (30 mg Intramuscular Given 06/19/23 1405)    Initial Impression / Assessment and Plan / UC Course  I have reviewed the triage vital signs and the nursing notes.  Pertinent labs & imaging results that were available during my care of  the patient were reviewed by me and considered in my medical decision making (see chart for details).     Suspect lumbar strain, treat with IM Toradol, Zanaflex, heat, soft, stretches, rest.  Return for worsening symptoms.  Final Clinical Impressions(s) / UC Diagnoses   Final diagnoses:  Strain of lumbar region, initial encounter   Discharge Instructions   None    ED Prescriptions     Medication Sig Dispense Auth. Provider   tiZANidine (ZANAFLEX) 4 MG capsule Take 1 capsule (4 mg total) by mouth 3 (three) times daily as needed for muscle spasms. Do not drink alcohol or drive while taking this medication.  May cause drowsiness. 15 capsule Particia Nearing, [place]      PDMP not reviewed this encounter.   Particia Nearing, [place] 06/19/23 1419

## 2023-08-27 DIAGNOSIS — C61 Malignant neoplasm of prostate: Secondary | ICD-10-CM | POA: Diagnosis not present

## 2023-09-03 DIAGNOSIS — C61 Malignant neoplasm of prostate: Secondary | ICD-10-CM | POA: Diagnosis not present

## 2023-09-09 DIAGNOSIS — E782 Mixed hyperlipidemia: Secondary | ICD-10-CM | POA: Diagnosis not present

## 2023-09-09 DIAGNOSIS — K429 Umbilical hernia without obstruction or gangrene: Secondary | ICD-10-CM | POA: Diagnosis not present

## 2023-09-09 DIAGNOSIS — E1136 Type 2 diabetes mellitus with diabetic cataract: Secondary | ICD-10-CM | POA: Diagnosis not present

## 2023-09-09 DIAGNOSIS — I10 Essential (primary) hypertension: Secondary | ICD-10-CM | POA: Diagnosis not present

## 2023-09-09 DIAGNOSIS — I2510 Atherosclerotic heart disease of native coronary artery without angina pectoris: Secondary | ICD-10-CM | POA: Diagnosis not present

## 2023-09-09 DIAGNOSIS — I700 Atherosclerosis of aorta: Secondary | ICD-10-CM | POA: Diagnosis not present

## 2023-09-09 DIAGNOSIS — D72819 Decreased white blood cell count, unspecified: Secondary | ICD-10-CM | POA: Diagnosis not present

## 2023-09-09 DIAGNOSIS — Z8546 Personal history of malignant neoplasm of prostate: Secondary | ICD-10-CM | POA: Diagnosis not present

## 2023-09-15 ENCOUNTER — Other Ambulatory Visit: Payer: Self-pay | Admitting: Cardiovascular Disease

## 2023-09-15 DIAGNOSIS — I214 Non-ST elevation (NSTEMI) myocardial infarction: Secondary | ICD-10-CM

## 2023-09-20 ENCOUNTER — Other Ambulatory Visit: Payer: Self-pay | Admitting: Cardiovascular Disease

## 2023-09-20 DIAGNOSIS — I214 Non-ST elevation (NSTEMI) myocardial infarction: Secondary | ICD-10-CM

## 2023-09-28 ENCOUNTER — Other Ambulatory Visit: Payer: Self-pay

## 2023-09-28 DIAGNOSIS — Z9841 Cataract extraction status, right eye: Secondary | ICD-10-CM | POA: Diagnosis not present

## 2023-09-28 DIAGNOSIS — Z9842 Cataract extraction status, left eye: Secondary | ICD-10-CM | POA: Diagnosis not present

## 2023-09-28 DIAGNOSIS — H26491 Other secondary cataract, right eye: Secondary | ICD-10-CM | POA: Diagnosis not present

## 2023-09-28 MED ORDER — ATORVASTATIN CALCIUM 80 MG PO TABS: 80.0000 mg | ORAL_TABLET | Freq: Every day | ORAL | 3 refills | Status: AC

## 2023-09-28 MED ORDER — EMPAGLIFLOZIN 10 MG PO TABS: 10.0000 mg | ORAL_TABLET | Freq: Every day | ORAL | 8 refills | Status: AC

## 2023-09-28 NOTE — Addendum Note (Signed)
 Addended by: Gayleen Kawasaki D on: 09/28/2023 04:24 PM   Modules accepted: Orders

## 2023-10-26 DIAGNOSIS — H26491 Other secondary cataract, right eye: Secondary | ICD-10-CM | POA: Diagnosis not present

## 2023-10-26 DIAGNOSIS — Z9841 Cataract extraction status, right eye: Secondary | ICD-10-CM | POA: Diagnosis not present

## 2024-02-05 IMAGING — CT NM PET TUM IMG SKULL BASE T - THIGH
1 of 7 series · 1 of 25 positions shown · non-contrast
Comparison: Chest CT from January 2007.

CLINICAL DATA: A 66-year-old male presents for evaluation of PSA
recurrence.

EXAM:
NUCLEAR MEDICINE PET SKULL BASE TO THIGH
TECHNIQUE: 8.2 mCi F18 Piflufolastat (Pylarify) was injected intravenously.
Full-ring PET imaging was performed from the skull base to thigh
after the radiotracer. CT data was obtained and used for attenuation
correction and anatomic localization.

[Series 3: pet sk_thigh ac · axial · 5.0mm · 4.07mm/px · 1 of 251 slices shown]
[im 151/251]
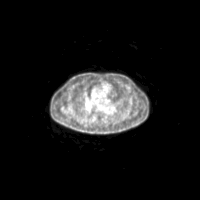

[1 of 25 positions shown; findings below may reference images not displayed]

FINDINGS: NECK

No radiotracer activity in neck lymph nodes.

Incidental CT finding: None

CHEST

No radiotracer accumulation within mediastinal or hilar lymph nodes.
No suspicious pulmonary nodules on the CT scan.

Incidental CT finding: Scattered aortic atherosclerosis. Scattered
three-vessel coronary artery calcifications. Heart size top normal
without pericardial effusion or thickening. Normal caliber central
pulmonary vessels. Limited assessment of cardiovascular structures
given lack of intravenous contrast.

No adenopathy by size criteria in the chest.

Minimal basilar atelectasis. Airways are patent. No signs of pleural
effusion.

ABDOMEN/PELVIS

Prostate: No focal activity in the prostate bed.

Lymph nodes: No abnormal radiotracer accumulation within pelvic or
abdominal nodes.

Liver: No evidence of liver metastasis

Incidental CT finding: Cysts in the RIGHT hepatic lobe. No signs of
pericholecystic stranding. No gross biliary duct distension.

Pancreatic contour is unremarkable.  Splenic size is normal.

No acute findings relative to kidneys and adrenal glands. Urinary
bladder extends inferiorly into the pelvis following prostatectomy.
Small hiatal hernia. No acute gastrointestinal findings with normal
appendix.

Aortic atherosclerosis. No sign of aneurysm. Smooth contour of the
IVC. There is no gastrohepatic or hepatoduodenal ligament
lymphadenopathy. No retroperitoneal or mesenteric lymphadenopathy.

No pelvic sidewall lymphadenopathy.

SKELETON

No focal  activity to suggest skeletal metastasis.
IMPRESSION: Post prostatectomy. No evidence of local recurrence or metastasis on
this PSMA PET.

Aortic atherosclerosis and coronary artery disease.

Aortic Atherosclerosis ([id number].A).

## 2024-02-25 DIAGNOSIS — H402231 Chronic angle-closure glaucoma, bilateral, mild stage: Secondary | ICD-10-CM | POA: Diagnosis not present

## 2024-02-25 DIAGNOSIS — Z961 Presence of intraocular lens: Secondary | ICD-10-CM | POA: Diagnosis not present

## 2024-02-25 DIAGNOSIS — H26491 Other secondary cataract, right eye: Secondary | ICD-10-CM | POA: Diagnosis not present

## 2024-03-14 DIAGNOSIS — I700 Atherosclerosis of aorta: Secondary | ICD-10-CM | POA: Diagnosis not present

## 2024-03-14 DIAGNOSIS — E119 Type 2 diabetes mellitus without complications: Secondary | ICD-10-CM | POA: Diagnosis not present

## 2024-03-14 DIAGNOSIS — K429 Umbilical hernia without obstruction or gangrene: Secondary | ICD-10-CM | POA: Diagnosis not present

## 2024-03-14 DIAGNOSIS — Z0000 Encounter for general adult medical examination without abnormal findings: Secondary | ICD-10-CM | POA: Diagnosis not present

## 2024-03-14 DIAGNOSIS — I10 Essential (primary) hypertension: Secondary | ICD-10-CM | POA: Diagnosis not present

## 2024-03-14 DIAGNOSIS — Z8546 Personal history of malignant neoplasm of prostate: Secondary | ICD-10-CM | POA: Diagnosis not present

## 2024-03-14 DIAGNOSIS — I2510 Atherosclerotic heart disease of native coronary artery without angina pectoris: Secondary | ICD-10-CM | POA: Diagnosis not present

## 2024-03-14 DIAGNOSIS — E782 Mixed hyperlipidemia: Secondary | ICD-10-CM | POA: Diagnosis not present

## 2024-03-14 DIAGNOSIS — Z1331 Encounter for screening for depression: Secondary | ICD-10-CM | POA: Diagnosis not present

## 2024-03-14 DIAGNOSIS — D72819 Decreased white blood cell count, unspecified: Secondary | ICD-10-CM | POA: Diagnosis not present

## 2024-03-14 DIAGNOSIS — E1169 Type 2 diabetes mellitus with other specified complication: Secondary | ICD-10-CM | POA: Diagnosis not present

## 2024-03-14 DIAGNOSIS — I252 Old myocardial infarction: Secondary | ICD-10-CM | POA: Diagnosis not present

## 2024-03-14 DIAGNOSIS — Z23 Encounter for immunization: Secondary | ICD-10-CM | POA: Diagnosis not present

## 2024-05-01 NOTE — Progress Notes (Signed)
 " Cardiology Office Note:  .   Date:  05/05/2024  ID:  Stephen [name], DOB [birth year], MRN [id number] PCP: Ransom Other, MD  [facility] Providers Cardiologist:  Darryle ONEIDA Decent, MD   History of Present Illness: .    Chief Complaint  Patient presents with   Follow-up    Stephen [name] is a 69 y.o. male with below history who presents for follow-up.   History of Present Illness   Stephen [name] is a 69 year old male with non-STEMI who presents with chest pain.  He experiences a dull ache in his chest primarily during activities involving heavy lifting, such as caregiving for his mother who recently lost the ability to walk. The pain is localized to his chest and does not occur during activities like walking on a treadmill, where he does not use his upper body. He notes that the pain does not resemble the discomfort experienced during his heart attack.  He has a history of non-STEMI in 2024, for which he underwent PCI to the middle LAD. His current medication regimen includes low-dose aspirin , metformin , Jardiance , atorvastatin , and losartan . Blood pressure readings at home have been variable, with higher readings in his right arm compared to his left. His diabetes is well-controlled with an A1c of 6.4, and his cholesterol levels are managed with atorvastatin , with a recent LDL of 46.  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 He takes two Tylenol  daily for pain management, although it is unspecified if it alleviates his chest discomfort. No trouble breathing. Reports chest pain with heavy lifting but not with walking on a treadmill.           Problem List NSTEMI -PCI to mid LAD and OM1 (09/14/2022) 2. HLD -T chol  112, HDL 49, LDL 46, Tg 89 3. HTN 4. DM -A1c 6.4    ROS: All other ROS reviewed and negative. Pertinent positives noted in the HPI.     Studies Reviewed: SABRA   EKG Interpretation Date/Time:  Thursday May 05 2024 09:03:55  EST Ventricular Rate:  73 PR Interval:  176 QRS Duration:  92 QT Interval:  364 QTC Calculation: 401 R Axis:   -53  Text Interpretation: Normal sinus rhythm Left axis deviation Minimal voltage criteria for LVH, may be normal variant ( R in aVL ) Inferior infarct Anterolateral infarct , old Unchanged from prior Confirmed by Decent Darryle [phone]) on 05/05/2024 9:22:07 AM   TTE 09/14/2022  1. Left ventricular ejection fraction, by estimation, is 60 to 65%. The  left ventricle has normal function. The left ventricle has no regional  wall motion abnormalities. There is moderate concentric left ventricular  hypertrophy. Left ventricular  diastolic parameters are consistent with Grade I diastolic dysfunction  (impaired relaxation).   2. Right ventricular systolic function is normal. The right ventricular  size is normal. Tricuspid regurgitation signal is inadequate for assessing  PA pressure.   3. The mitral valve is normal in structure. No evidence of mitral valve  regurgitation.   4. The aortic valve is tricuspid. Aortic valve regurgitation is not  visualized.   5. The inferior vena cava is normal in size with greater than 50%  respiratory variability, suggesting right atrial pressure of 3 mmHg.   LHC 09/14/2022 1.  Severe two-vessel coronary artery disease with total occlusion of the first OM branch of the circumflex and severe stenosis of the proximal to mid LAD, both lesions treated with  PCI using a single drug-eluting stent at each lesion site 2.  Patent RCA with mild nonobstructive disease 3.  Normal LVEF by echo assessment with no regional wall motion abnormalities, normal LVEDP at cardiac catheterization  Physical Exam:   VS:  BP (!) 146/98   Pulse 73   Ht 5' 8 (1.727 m)   Wt 199 lb 6.4 oz (90.4 kg)   SpO2 97%   BMI 30.32 kg/m    Wt Readings from Last 3 Encounters:  05/05/24 199 lb 6.4 oz (90.4 kg)  06/02/23 192 lb 6.4 oz (87.3 kg)  05/15/23 179 lb 14.3 oz (81.6 kg)     GEN: Well nourished, well developed in no acute distress NECK: No JVD; No carotid bruits CARDIAC: RRR, no murmurs, rubs, gallops RESPIRATORY:  Clear to auscultation without rales, wheezing or rhonchi  ABDOMEN: Soft, non-tender, non-distended EXTREMITIES:  No edema; No deformity  ASSESSMENT AND PLAN: .   Assessment and Plan    Musculoskeletal chest pain Intermittent dull ache in lower left chest, exacerbated by heavy lifting and upper body movements. Pain reproducible with palpation, suggesting musculoskeletal origin. EKG shows inferior ST depressions with TWI, likely related to prior OM1 infarct, unchanged from prior. - Recommended stress test to rule out cardiac causes. - Advised use of Tylenol  for pain management.  Coronary artery disease status post non-STEMI and PCI Non-STEMI in 2024 with PCI to middle AD. Current EKG changes likely related to prior OM1 infarct. - Continue aspirin  81 mg daily. - Ordered stress test to update cardiac status.  Essential hypertension Blood pressure slightly elevated in office, well controlled at home with readings around 124/77 mmHg. Right arm blood pressure higher than left, possibly due to anatomical variation. - Continue losartan  100 mg daily.  Hyperlipidemia Cholesterol levels well controlled with atorvastatin . Recent LDL is 46 mg/dL. - Continue atorvastatin  80 mg daily.  Type 2 diabetes mellitus Diabetes well controlled with current medication regimen. Recent A1c is 6.4%. - Continue metformin  and Jardiance .                Follow-up: Return in about 1 year (around 05/05/2025).  Signed, Darryle DASEN. Barbaraann, MD, [facility]  [address] [facility]  [address] [place], [place] [address] [phone]  9:39 AM   "

## 2024-05-05 ENCOUNTER — Encounter: Payer: Self-pay | Admitting: Cardiovascular Disease

## 2024-05-05 ENCOUNTER — Ambulatory Visit: Attending: Cardiovascular Disease | Admitting: Cardiovascular Disease

## 2024-05-05 VITALS — BP 146/98 | HR 73 | Ht 68.0 in | Wt 199.4 lb

## 2024-05-05 DIAGNOSIS — I2510 Atherosclerotic heart disease of native coronary artery without angina pectoris: Secondary | ICD-10-CM | POA: Diagnosis not present

## 2024-05-05 DIAGNOSIS — I10 Essential (primary) hypertension: Secondary | ICD-10-CM

## 2024-05-05 DIAGNOSIS — E785 Hyperlipidemia, unspecified: Secondary | ICD-10-CM

## 2024-05-05 MED ORDER — NITROGLYCERIN 0.4 MG SL SUBL: 0.4000 mg | SUBLINGUAL_TABLET | SUBLINGUAL | 3 refills | Status: AC | PRN

## 2024-05-05 NOTE — Patient Instructions (Signed)
 Medication Instructions:  Your physician recommends that you continue on your current medications as directed. Please refer to the Current Medication list given to you today.  *If you need a refill on your cardiac medications before your next appointment, please call your pharmacy*   Testing/Procedures: Dr. Barbaraann has ordered a Lexiscan  Myocardial Perfusion Imaging Study.  Please arrive 15 minutes prior to your appointment time for registration and insurance purposes.   The test will take approximately 3 to 4 hours to complete; you may bring reading material.  If someone comes with you to your appointment, they will need to remain in the main lobby due to limited space in the testing area. **If you are pregnant or breastfeeding, please notify the nuclear lab prior to your appointment**   How to prepare for your Myocardial Perfusion Test: Do not eat or drink 3 hours prior to your test, except you may have water . Do not consume products containing caffeine (regular or decaffeinated) 12 hours prior to your test. (ex: coffee, chocolate, sodas, tea). Do wear comfortable clothes (no dresses or overalls) and walking shoes, tennis shoes preferred (No heels or open toe shoes are allowed). Do NOT wear cologne, perfume, aftershave, or lotions (deodorant is allowed). If you use an inhaler, use it the AM of your test and bring it with you.  If you use a nebulizer, use it the AM of your test.  If these instructions are not followed, your test will have to be rescheduled.   Follow-Up: At [facility], you and your health needs are our priority.  As part of our continuing mission to provide you with exceptional heart care, our providers are all part of one team.  This team includes your primary Cardiologist (physician) and Advanced Practice Providers or APPs (Physician Assistants and Nurse Practitioners) who all work together to provide you with the care you need, when you need it.  Your next  appointment:   12 month(s)  Provider:   Darryle ONEIDA Barbaraann, MD

## 2024-05-06 ENCOUNTER — Other Ambulatory Visit: Payer: Self-pay | Admitting: Cardiovascular Disease

## 2024-05-06 DIAGNOSIS — I2510 Atherosclerotic heart disease of native coronary artery without angina pectoris: Secondary | ICD-10-CM

## 2024-05-06 DIAGNOSIS — E785 Hyperlipidemia, unspecified: Secondary | ICD-10-CM

## 2024-05-06 DIAGNOSIS — I10 Essential (primary) hypertension: Secondary | ICD-10-CM

## 2024-05-11 ENCOUNTER — Telehealth: Payer: Self-pay | Admitting: *Deleted

## 2024-05-11 NOTE — Telephone Encounter (Signed)
 Spoke to patient to remind him about his STRESS TEST on 05/13/24 at 7:45.

## 2024-05-13 ENCOUNTER — Other Ambulatory Visit: Payer: Self-pay | Admitting: Cardiovascular Disease

## 2024-05-13 ENCOUNTER — Ambulatory Visit (HOSPITAL_COMMUNITY)
Admission: RE | Admit: 2024-05-13 | Discharge: 2024-05-13 | Disposition: A | Discharge status: Home / Self Care | Source: Ambulatory Visit | Attending: Cardiovascular Disease | Admitting: Cardiovascular Disease

## 2024-05-13 DIAGNOSIS — I2510 Atherosclerotic heart disease of native coronary artery without angina pectoris: Secondary | ICD-10-CM | POA: Insufficient documentation

## 2024-05-13 DIAGNOSIS — E785 Hyperlipidemia, unspecified: Secondary | ICD-10-CM | POA: Insufficient documentation

## 2024-05-13 DIAGNOSIS — I10 Essential (primary) hypertension: Secondary | ICD-10-CM | POA: Insufficient documentation

## 2024-05-13 LAB — MYOCARDIAL PERFUSION IMAGING
LV dias vol: 132 mL (ref 62–150)
LV sys vol: 79 mL
Nuc Stress EF: 40 %
Peak HR: 101 {beats}/min
Rest HR: 59 {beats}/min
Rest Nuclear Isotope Dose: 10.9 mCi
SDS: 1
SRS: 7
SSS: 5
ST Depression (mm): 0 mm
Stress Nuclear Isotope Dose: 31.7 mCi
TID: 1.1

## 2024-05-13 MED ORDER — TECHNETIUM TC 99M TETROFOSMIN IV KIT
10.9000 | PACK | Freq: Once | INTRAVENOUS | Status: AC | PRN
  Administered 2024-05-13: 10.9 via INTRAVENOUS

## 2024-05-13 MED ORDER — TECHNETIUM TC 99M TETROFOSMIN IV KIT
31.7000 | PACK | Freq: Once | INTRAVENOUS | Status: AC | PRN
  Administered 2024-05-13: 31.7 via INTRAVENOUS

## 2024-05-13 MED ORDER — REGADENOSON 0.4 MG/5ML IV SOLN
0.4000 mg | Freq: Once | INTRAVENOUS | Status: AC
  Administered 2024-05-13: 0.4 mg via INTRAVENOUS

## 2024-05-13 MED ORDER — REGADENOSON 0.4 MG/5ML IV SOLN
INTRAVENOUS | Status: AC
  Filled 2024-05-13: qty 5

## 2024-05-14 ENCOUNTER — Ambulatory Visit: Payer: Self-pay | Admitting: Cardiovascular Disease

## 2024-05-16 NOTE — Telephone Encounter (Signed)
 In accordance with refill protocols, please review and address the following requirements before this medication refill can be authorized:  Labs
